# Patient Record
Sex: Female | Born: 1960 | Race: White | Hispanic: No | Smoking: Never smoker
Health system: Southern US, Community
[De-identification: ages and names within clinical notes are randomized; demographics above are authoritative.]

## PROBLEM LIST (undated history)

## (undated) HISTORY — PX: KNEE SURGERY: SHX244

---

## 2006-09-24 ENCOUNTER — Ambulatory Visit: Payer: Self-pay | Admitting: Family Medicine

## 2006-10-27 ENCOUNTER — Ambulatory Visit: Payer: Self-pay | Admitting: Orthopaedic Surgery

## 2006-10-28 ENCOUNTER — Ambulatory Visit: Payer: Self-pay | Admitting: Orthopaedic Surgery

## 2009-07-31 ENCOUNTER — Ambulatory Visit: Payer: Self-pay | Admitting: Physician Assistant

## 2010-01-11 ENCOUNTER — Ambulatory Visit: Payer: Self-pay | Admitting: Family Medicine

## 2011-02-13 ENCOUNTER — Ambulatory Visit: Payer: Self-pay | Admitting: Internal Medicine

## 2012-03-04 ENCOUNTER — Ambulatory Visit: Payer: Self-pay | Admitting: Family Medicine

## 2012-08-09 ENCOUNTER — Other Ambulatory Visit: Payer: Self-pay | Admitting: Nurse Practitioner

## 2012-08-09 LAB — SYNOVIAL CELL COUNT + DIFF, W/ CRYSTALS
Basophil: 0 %
Lymphocytes: 49 %
Neutrophils: 11 %
Nucleated Cell Count: 1293 /mm3
Other Cells BF: 0 %

## 2012-08-20 ENCOUNTER — Ambulatory Visit: Payer: Self-pay | Admitting: Orthopedic Surgery

## 2014-09-20 ENCOUNTER — Ambulatory Visit: Payer: Self-pay | Admitting: Family Medicine

## 2014-10-24 ENCOUNTER — Ambulatory Visit: Payer: Self-pay | Admitting: Family Medicine

## 2014-11-17 DIAGNOSIS — M7500 Adhesive capsulitis of unspecified shoulder: Secondary | ICD-10-CM | POA: Insufficient documentation

## 2014-11-17 DIAGNOSIS — M7582 Other shoulder lesions, left shoulder: Secondary | ICD-10-CM | POA: Insufficient documentation

## 2015-03-28 ENCOUNTER — Other Ambulatory Visit: Payer: Self-pay

## 2015-03-28 DIAGNOSIS — M25512 Pain in left shoulder: Secondary | ICD-10-CM

## 2015-08-30 ENCOUNTER — Encounter: Payer: Self-pay | Admitting: Family Medicine

## 2015-08-30 ENCOUNTER — Ambulatory Visit (INDEPENDENT_AMBULATORY_CARE_PROVIDER_SITE_OTHER): Payer: BC Managed Care – PPO | Admitting: Family Medicine

## 2015-08-30 VITALS — BP 100/80 | HR 80 | Ht 66.0 in | Wt 159.0 lb

## 2015-08-30 DIAGNOSIS — J0100 Acute maxillary sinusitis, unspecified: Secondary | ICD-10-CM | POA: Diagnosis not present

## 2015-08-30 DIAGNOSIS — J40 Bronchitis, not specified as acute or chronic: Secondary | ICD-10-CM

## 2015-08-30 MED ORDER — AZITHROMYCIN 250 MG PO TABS: ORAL_TABLET | ORAL | Status: DC

## 2015-08-30 MED ORDER — GUAIFENESIN-CODEINE 100-10 MG/5ML PO SOLN: 5.0000 mL | Freq: Three times a day (TID) | ORAL | Status: DC | PRN

## 2015-08-30 NOTE — Progress Notes (Signed)
Name: Tonya Cox   MRN: [id number]    DOB: [birth year]   Date:08/30/2015       Progress Note  Subjective  Chief Complaint  Chief Complaint  Patient presents with  . Sinusitis    cough and cong/ drainage in throat    Sinusitis This is a new problem. The current episode started in the past 7 days. The problem has been gradually worsening since onset. There has been no fever. Her pain is at a severity of 3/10. The pain is mild. Associated symptoms include congestion, coughing, headaches, sinus pressure and a sore throat. Pertinent negatives include no chills, diaphoresis, ear pain, neck pain, shortness of breath, sneezing or swollen glands. Past treatments include acetaminophen. The treatment provided no relief.  Cough This is a new problem. The current episode started in the past 7 days. The cough is productive of purulent sputum. Associated symptoms include headaches and a sore throat. Pertinent negatives include no chest pain, chills, ear pain, fever, heartburn, myalgias, rash, shortness of breath, weight loss or wheezing. She has tried nothing for the symptoms. The treatment provided no relief. There is no history of environmental allergies.    No problem-specific assessment & plan notes found for this encounter.   No past medical history on file.  Past Surgical History  Procedure Laterality Date  . Knee surgery Left     No family history on file.  Social History   Social History  . Marital Status: Married    Spouse Name: N/A  . Number of Children: N/A  . Years of Education: N/A   Occupational History  . Not on file.   Social History Main Topics  . Smoking status: Never Smoker   . Smokeless tobacco: Not on file  . Alcohol Use: 0.0 oz/week    0 Standard drinks or equivalent per week  . Drug Use: No  . Sexual Activity: Not on file   Other Topics Concern  . Not on file   Social History Narrative  . No narrative on file    No Known Allergies   Review of  Systems  Constitutional: Negative for fever, chills, weight loss, malaise/fatigue and diaphoresis.  HENT: Positive for congestion, sinus pressure and sore throat. Negative for ear discharge, ear pain and sneezing.   Eyes: Negative for blurred vision.  Respiratory: Positive for cough. Negative for sputum production, shortness of breath and wheezing.   Cardiovascular: Negative for chest pain, palpitations and leg swelling.  Gastrointestinal: Negative for heartburn, nausea, abdominal pain, diarrhea, constipation, blood in stool and melena.  Genitourinary: Negative for dysuria, urgency, frequency and hematuria.  Musculoskeletal: Negative for myalgias, back pain, joint pain and neck pain.  Skin: Negative for rash.  Neurological: Positive for headaches. Negative for dizziness, tingling, sensory change and focal weakness.  Endo/Heme/Allergies: Negative for environmental allergies and polydipsia. Does not bruise/bleed easily.  Psychiatric/Behavioral: Negative for depression and suicidal ideas. The patient is not nervous/anxious and does not have insomnia.      Objective  Filed Vitals:   08/30/15 1512  BP: 100/80  Pulse: 80  Height:  (1.676 m)  Weight: 159 lb (72.122 kg)    Physical Exam  Constitutional: She is well-developed, well-nourished, and in no distress. No distress.  HENT:  Head: Normocephalic and atraumatic.  Right Ear: External ear normal.  Left Ear: External ear normal.  Nose: Nose normal.  Mouth/Throat: Oropharynx is clear and moist.  Eyes: Conjunctivae and EOM are normal. Pupils are equal, round, and reactive to  light. Right eye exhibits no discharge. Left eye exhibits no discharge.  Neck: Normal range of motion. Neck supple. No JVD present. No thyromegaly present.  Cardiovascular: Normal rate, regular rhythm, normal heart sounds and intact distal pulses.  Exam reveals no gallop and no friction rub.   No murmur heard. Pulmonary/Chest: Effort normal and breath sounds  normal.  Abdominal: Soft. Bowel sounds are normal. She exhibits no mass. There is no tenderness. There is no guarding.  Musculoskeletal: Normal range of motion. She exhibits no edema.  Lymphadenopathy:    She has no cervical adenopathy.  Neurological: She is alert. She has normal reflexes.  Skin: Skin is warm and dry. She is not diaphoretic.  Psychiatric: Mood and affect normal.  Nursing note and vitals reviewed.     Assessment & Plan  Problem List Items Addressed This Visit    None    Visit Diagnoses    Acute maxillary sinusitis, recurrence not specified    -  Primary    Relevant Medications    azithromycin (ZITHROMAX) 250 MG tablet    guaiFENesin-codeine 100-10 MG/5ML syrup    Bronchitis        Relevant Medications    azithromycin (ZITHROMAX) 250 MG tablet    guaiFENesin-codeine 100-10 MG/5ML syrup         Dr. [facility]  08/30/2015

## 2015-09-03 ENCOUNTER — Other Ambulatory Visit: Payer: Self-pay

## 2015-09-03 DIAGNOSIS — M25561 Pain in right knee: Secondary | ICD-10-CM

## 2015-09-04 ENCOUNTER — Encounter: Payer: Self-pay | Admitting: Family Medicine

## 2015-09-04 ENCOUNTER — Ambulatory Visit (INDEPENDENT_AMBULATORY_CARE_PROVIDER_SITE_OTHER): Payer: BC Managed Care – PPO | Admitting: Family Medicine

## 2015-09-04 VITALS — BP 130/80 | HR 72 | Ht 66.0 in | Wt 159.0 lb

## 2015-09-04 DIAGNOSIS — S83206A Unspecified tear of unspecified meniscus, current injury, right knee, initial encounter: Secondary | ICD-10-CM

## 2015-09-04 MED ORDER — TRAMADOL HCL 50 MG PO TABS: 50.0000 mg | ORAL_TABLET | Freq: Three times a day (TID) | ORAL | Status: DC | PRN

## 2015-09-04 NOTE — Progress Notes (Signed)
Name: Tonya Cox   MRN: [id number]    DOB: [birth year]   Date:09/04/2015       Progress Note  Subjective  Chief Complaint  Chief Complaint  Patient presents with  . Knee Pain    R) knee pain that started 5 days ago- woke up with pain after crawling around on floor. Constant ache that is sharp at times especially when trying to move it to stand up.     Knee Pain  The incident occurred more than 1 week ago. There was no injury mechanism. The pain is present in the right knee. The quality of the pain is described as aching. The pain is at a severity of 8/10. The pain is severe. The pain has been fluctuating since onset. Associated symptoms include an inability to bear weight and a loss of motion. Pertinent negatives include no tingling. The symptoms are aggravated by movement. She has tried acetaminophen (tramadol) for the symptoms. The treatment provided mild relief.    No problem-specific assessment & plan notes found for this encounter.   No past medical history on file.  Past Surgical History  Procedure Laterality Date  . Knee surgery Left     No family history on file.  Social History   Social History  . Marital Status: Married    Spouse Name: N/A  . Number of Children: N/A  . Years of Education: N/A   Occupational History  . Not on file.   Social History Main Topics  . Smoking status: Never Smoker   . Smokeless tobacco: Not on file  . Alcohol Use: 0.0 oz/week    0 Standard drinks or equivalent per week  . Drug Use: No  . Sexual Activity: Not on file   Other Topics Concern  . Not on file   Social History Narrative    No Known Allergies   Review of Systems  Constitutional: Negative for fever, chills, weight loss and malaise/fatigue.  HENT: Negative for ear discharge, ear pain and sore throat.   Eyes: Negative for blurred vision.  Respiratory: Negative for cough, sputum production, shortness of breath and wheezing.   Cardiovascular: Negative for  chest pain, palpitations and leg swelling.  Gastrointestinal: Negative for heartburn, nausea, abdominal pain, diarrhea, constipation, blood in stool and melena.  Genitourinary: Negative for dysuria, urgency, frequency and hematuria.  Musculoskeletal: Negative for myalgias, back pain, joint pain and neck pain.  Skin: Negative for rash.  Neurological: Negative for dizziness, tingling, sensory change, focal weakness and headaches.  Endo/Heme/Allergies: Negative for environmental allergies and polydipsia. Does not bruise/bleed easily.  Psychiatric/Behavioral: Negative for depression and suicidal ideas. The patient is not nervous/anxious and does not have insomnia.      Objective  Filed Vitals:   09/04/15 1442  BP: 130/80  Pulse: 72  Height:  (1.676 m)  Weight: 159 lb (72.122 kg)    Physical Exam  Constitutional: She is well-developed, well-nourished, and in no distress. No distress.  HENT:  Head: Normocephalic and atraumatic.  Nose: Nose normal.  Mouth/Throat: Oropharynx is clear and moist.  Eyes: Conjunctivae and EOM are normal. Pupils are equal, round, and reactive to light. Right eye exhibits no discharge. Left eye exhibits no discharge.  Neck: Normal range of motion. Neck supple. No JVD present.  Cardiovascular: Normal rate, regular rhythm, normal heart sounds and intact distal pulses.  Exam reveals no gallop and no friction rub.   No murmur heard. Pulmonary/Chest: Effort normal and breath sounds normal.  Abdominal: Soft. Bowel  sounds are normal.  Musculoskeletal: Normal range of motion. She exhibits tenderness. She exhibits no edema.  Right medial joint line  Neurological: She is alert.  Skin: Skin is warm and dry. She is not diaphoretic.  Psychiatric: Mood and affect normal.  Nursing note and vitals reviewed.     Assessment & Plan  Problem List Items Addressed This Visit    None    Visit Diagnoses    Acute meniscal tear of knee, right, initial encounter    -   Primary    Relevant Medications    traMADol (ULTRAM) 50 MG tablet         Dr. [facility]  09/04/2015

## 2015-09-04 NOTE — Patient Instructions (Signed)
Meniscus Tear °A meniscus tear is a knee injury in which a piece of the meniscus is torn. The meniscus is a thick, rubbery, wedge-shaped cartilage in the knee. Two menisci are located in each knee. They sit between the upper bone (femur) and lower bone (tibia) that make up the knee joint. Each meniscus acts as a shock absorber for the knee. °A torn meniscus is one of the most common types of knee injuries. This injury can range from mild to severe. Surgery may be needed for a severe tear. °CAUSES °This injury may be caused by any squatting, twisting, or pivoting movement. Sports-related injuries are the most common cause. These often occur from: °· Running and stopping suddenly. °· Changing direction. °· Being tackled or knocked off your feet. °As people get older, their meniscus gets thinner and weaker. In these people, tears can happen more easily, such as from climbing stairs.  °RISK FACTORS °This injury is more likely to happen to: °· People who play contact sports. °· Males. °· People who are 30-40 years of age. °SYMPTOMS  °Symptoms of this injury include: °· Knee pain, especially at the side of the knee joint. You may feel pain when the injury occurs, or you may only hear a pop and feel pain later. °· A feeling that your knee is clicking, catching, locking, or giving way. °· Not being able to fully bend or extend your knee. °· Bruising or swelling in your knee. °DIAGNOSIS  °This injury may be diagnosed based on your symptoms and a physical exam. The physical exam may include: °· Moving your knee in different ways. °· Feeling for tenderness. °· Listening for a clicking sound. °· Checking if your knee locks or catches. °You may also have tests, such as: °· X-rays. °· MRI. °· A procedure to look inside your knee with a narrow surgical telescope (arthroscopy). °You may be referred to a knee specialist (orthopedic surgeon). °TREATMENT  °Treatment for this injury depends on the severity of the tear. Treatment for a  mild tear may include: °· Rest. °· Medicine to reduce pain and swelling. This is usually a nonsteroidal anti-inflammatory drug (NSAID). °· A knee brace or an elastic sleeve or wrap. °· Using crutches or a walker to keep weight off your knee and to help you walk. °· Exercises to strengthen your knee (physical therapy). °You may need surgery if you have a severe tear or if other treatments are not working.  °HOME CARE INSTRUCTIONS °Managing Pain and Swelling °· Take over-the-counter and prescription medicines only as told by your health care provider. °· If directed, apply ice to the injured area: °¨ Put ice in a plastic bag. °¨ Place a towel between your skin and the bag. °¨ Leave the ice on for 20 minutes, 2-3 times per day. °· Raise (elevate) the injured area above the level of your heart while you are sitting or lying down. °Activity °· Do not use the injured limb to support your body weight until your health care provider says that you can. Use crutches or a walker as told by your health care provider. °· Return to your normal activities as told by your health care provider. Ask your health care provider what activities are safe for you. °· Perform range-of-motion exercises only as told by your health care provider. °· Begin doing exercises to strengthen your knee and leg muscles only as told by your health care provider. After you recover, your health care provider may recommend these exercises to   help prevent another injury. °General Instructions °· Use a knee brace or elastic wrap as told by your health care provider. °· Keep all follow-up visits as told by your health care provider. This is important. °SEEK MEDICAL CARE IF: °· You have a fever. °· Your knee becomes red, tender, or swollen. °· Your pain medicine is not helping. °· Your symptoms get worse or do not improve after 2 weeks of home care. °  °This information is not intended to replace advice given to you by your health care provider. Make sure you  discuss any questions you have with your health care provider. °  °Document Released: 10/18/2002 Document Revised: 04/18/2015 Document Reviewed: 11/20/2014 °Elsevier Interactive Patient Education ©2016 [employer] ° °

## 2015-09-06 ENCOUNTER — Other Ambulatory Visit: Payer: Self-pay

## 2015-09-06 DIAGNOSIS — M25561 Pain in right knee: Secondary | ICD-10-CM

## 2015-09-06 MED ORDER — HYDROCODONE-ACETAMINOPHEN 5-300 MG PO TABS: 1.0000 | ORAL_TABLET | Freq: Three times a day (TID) | ORAL | Status: DC | PRN

## 2015-09-17 ENCOUNTER — Other Ambulatory Visit: Payer: Self-pay | Admitting: Physician Assistant

## 2015-09-17 DIAGNOSIS — S83211A Bucket-handle tear of medial meniscus, current injury, right knee, initial encounter: Secondary | ICD-10-CM

## 2015-09-28 ENCOUNTER — Ambulatory Visit (HOSPITAL_COMMUNITY)
Admission: RE | Admit: 2015-09-28 | Discharge: 2015-09-28 | Disposition: A | Discharge status: Home / Self Care | Payer: BC Managed Care – PPO | Source: Ambulatory Visit | Attending: Physician Assistant | Admitting: Physician Assistant

## 2015-09-28 DIAGNOSIS — S83211A Bucket-handle tear of medial meniscus, current injury, right knee, initial encounter: Secondary | ICD-10-CM | POA: Insufficient documentation

## 2015-09-28 DIAGNOSIS — X58XXXA Exposure to other specified factors, initial encounter: Secondary | ICD-10-CM | POA: Insufficient documentation

## 2015-09-28 DIAGNOSIS — M25461 Effusion, right knee: Secondary | ICD-10-CM | POA: Insufficient documentation

## 2016-03-07 ENCOUNTER — Ambulatory Visit (INDEPENDENT_AMBULATORY_CARE_PROVIDER_SITE_OTHER): Payer: BC Managed Care – PPO | Admitting: Family Medicine

## 2016-03-07 ENCOUNTER — Encounter: Payer: Self-pay | Admitting: Family Medicine

## 2016-03-07 VITALS — BP 140/100 | HR 60 | Ht 69.0 in | Wt 160.0 lb

## 2016-03-07 DIAGNOSIS — L259 Unspecified contact dermatitis, unspecified cause: Secondary | ICD-10-CM | POA: Diagnosis not present

## 2016-03-07 MED ORDER — PREDNISONE 10 MG PO TABS: ORAL_TABLET | ORAL | 1 refills | Status: DC

## 2016-03-07 NOTE — Progress Notes (Signed)
Name: Tonya Cox   MRN: [id number]    DOB: [birth year]   Date:03/07/2016       Progress Note  Subjective  Chief Complaint  Chief Complaint  Patient presents with  . Rash    poison ivey on arm- been using hydrocortisone 2%- eyes are now swollen and itchy. Eyes were matted this am    Rash  This is a new problem. The current episode started in the past 7 days. The problem has been gradually worsening since onset. The affected locations include the face. The rash is characterized by itchiness and pain (described as burning). Pertinent negatives include no congestion, cough, eye pain, facial edema or fever. Past treatments include antihistamine and topical steroids. The treatment provided no relief.    No problem-specific Assessment & Plan notes found for this encounter.   History reviewed. No pertinent past medical history.  Past Surgical History:  Procedure Laterality Date  . KNEE SURGERY Left     Family History  Problem Relation Age of Onset  . Cancer Father   . Diabetes Father   . Heart disease Maternal Grandfather   . Diabetes Paternal Grandfather     Social History   Social History  . Marital status: Married    Spouse name: N/A  . Number of children: N/A  . Years of education: N/A   Occupational History  . Not on file.   Social History Main Topics  . Smoking status: Never Smoker  . Smokeless tobacco: Never Used  . Alcohol use 0.0 oz/week  . Drug use: No  . Sexual activity: Yes   Other Topics Concern  . Not on file   Social History Narrative  . No narrative on file    No Known Allergies   Review of Systems  Constitutional: Negative for fever.  HENT: Negative for congestion.   Eyes: Negative for pain.  Respiratory: Negative for cough.   Skin: Positive for rash.     Objective  Vitals:   03/07/16 1132  BP: (!) 140/100  Pulse: 60  Weight: 160 lb (72.6 kg)  Height:  (1.753 m)    Physical Exam  Constitutional: She is  well-developed, well-nourished, and in no distress. No distress.  HENT:  Head: Normocephalic and atraumatic.  Right Ear: External ear normal.  Left Ear: External ear normal.  Nose: Nose normal.  Mouth/Throat: Oropharynx is clear and moist.  Eyes: Conjunctivae and EOM are normal. Pupils are equal, round, and reactive to light. Right eye exhibits no discharge. Left eye exhibits no discharge.  Neck: Normal range of motion. Neck supple. No JVD present. No thyromegaly present.  Cardiovascular: Normal rate, regular rhythm, normal heart sounds and intact distal pulses.  Exam reveals no gallop and no friction rub.   No murmur heard. Pulmonary/Chest: Effort normal and breath sounds normal.  Abdominal: Soft. Bowel sounds are normal. She exhibits no mass. There is no tenderness. There is no guarding.  Musculoskeletal: Normal range of motion. She exhibits no edema or tenderness.  Lymphadenopathy:    She has no cervical adenopathy.  Neurological: She is alert. She has normal reflexes.  Skin: Skin is warm and dry. Rash noted. She is not diaphoretic. There is erythema. No pallor.  Psychiatric: Mood and affect normal.  Nursing note and vitals reviewed.     Assessment & Plan  Problem List Items Addressed This Visit    None    Visit Diagnoses    Contact dermatitis    -  Primary  Relevant Medications   predniSONE (DELTASONE) 10 MG tablet        Dr. Elizabeth Sauer [facility]  03/07/16

## 2016-05-14 ENCOUNTER — Ambulatory Visit: Payer: BC Managed Care – PPO | Admitting: Family Medicine

## 2016-10-06 ENCOUNTER — Ambulatory Visit (INDEPENDENT_AMBULATORY_CARE_PROVIDER_SITE_OTHER): Payer: BC Managed Care – PPO | Admitting: Family Medicine

## 2016-10-06 VITALS — BP 140/82 | HR 88 | Temp 98.0°F | Ht 69.0 in | Wt 160.0 lb

## 2016-10-06 DIAGNOSIS — R69 Illness, unspecified: Secondary | ICD-10-CM

## 2016-10-06 DIAGNOSIS — J0100 Acute maxillary sinusitis, unspecified: Secondary | ICD-10-CM

## 2016-10-06 DIAGNOSIS — J111 Influenza due to unidentified influenza virus with other respiratory manifestations: Secondary | ICD-10-CM

## 2016-10-06 MED ORDER — OSELTAMIVIR PHOSPHATE 75 MG PO CAPS: 75.0000 mg | ORAL_CAPSULE | Freq: Two times a day (BID) | ORAL | 0 refills | Status: DC

## 2016-10-06 NOTE — Progress Notes (Signed)
Name: Tonya Cox   MRN: [id number]    DOB: Oct 08, 1960   Date:10/06/2016       Progress Note  Subjective  Chief Complaint  Chief Complaint  Patient presents with  . Sinusitis    on day 5 of [employer]- "half of class is out with confirmed flu and viruses"- had a fever on Friday, slept. On Sat started with body aches and fever in the pm. Headache is really bad as well as back ache    Sinusitis  This is a new problem. The current episode started in the past 7 days. The problem is unchanged. The maximum temperature recorded prior to her arrival was 100.4 - 100.9 F. Associated symptoms include chills, congestion, diaphoresis, headaches, neck pain, sinus pressure and sneezing. Pertinent negatives include no coughing, ear pain, hoarse voice, shortness of breath, sore throat or swollen glands. Past treatments include acetaminophen (azithromycin). The treatment provided mild relief.  Fever   This is a new problem. The current episode started in the past 7 days. The problem occurs intermittently. The problem has been waxing and waning. Her temperature was unmeasured prior to arrival. Associated symptoms include congestion and headaches. Pertinent negatives include no abdominal pain, chest pain, coughing, diarrhea, ear pain, muscle aches, nausea, rash, sleepiness, sore throat, urinary pain, vomiting or wheezing. She has tried acetaminophen for the symptoms.    No problem-specific Assessment & Plan notes found for this encounter.   No past medical history on file.  Past Surgical History:  Procedure Laterality Date  . KNEE SURGERY Left     Family History  Problem Relation Age of Onset  . Cancer Father   . Diabetes Father   . Heart disease Maternal Grandfather   . Diabetes Paternal Grandfather     Social History   Social History  . Marital status: Married    Spouse name: N/A  . Number of children: N/A  . Years of education: N/A   Occupational History  . Not on file.   Social  History Main Topics  . Smoking status: Never Smoker  . Smokeless tobacco: Never Used  . Alcohol use 0.0 oz/week  . Drug use: No  . Sexual activity: Yes   Other Topics Concern  . Not on file   Social History Narrative  . No narrative on file    No Known Allergies  Outpatient Medications Prior to Visit  Medication Sig Dispense Refill  . cyclobenzaprine (FLEXERIL) 5 MG tablet Take 5 mg by mouth at bedtime.    Marland Kitchen Hydrocodone-Acetaminophen 5-300 MG TABS Take 1 tablet by mouth every 8 (eight) hours as needed. (Patient not taking: Reported on 03/07/2016) 30 each 0  . predniSONE (DELTASONE) 10 MG tablet Taper 6,6,6,5,5,5,4,4,3,3,2,2,1,1 53 tablet 1   No facility-administered medications prior to visit.     Review of Systems  Constitutional: Positive for chills, diaphoresis and fever. Negative for malaise/fatigue and weight loss.  HENT: Positive for congestion, sinus pressure and sneezing. Negative for ear discharge, ear pain, hoarse voice and sore throat.   Eyes: Negative for blurred vision.  Respiratory: Negative for cough, sputum production, shortness of breath and wheezing.   Cardiovascular: Negative for chest pain, palpitations and leg swelling.  Gastrointestinal: Negative for abdominal pain, blood in stool, constipation, diarrhea, heartburn, melena, nausea and vomiting.  Genitourinary: Negative for dysuria, frequency, hematuria and urgency.  Musculoskeletal: Positive for neck pain. Negative for back pain, joint pain and myalgias.  Skin: Negative for rash.  Neurological: Positive for headaches. Negative for dizziness,  tingling, sensory change and focal weakness.  Endo/Heme/Allergies: Negative for environmental allergies and polydipsia. Does not bruise/bleed easily.  Psychiatric/Behavioral: Negative for depression and suicidal ideas. The patient is not nervous/anxious and does not have insomnia.        No ahedonism     Objective  Vitals:   10/06/16 0758  BP: 140/82  Pulse: 88   Temp: 98 F (36.7 C)  TempSrc: Oral  Weight: 160 lb (72.6 kg)  Height: 5\' 9"  (1.753 m)    Physical Exam  Constitutional: She is well-developed, well-nourished, and in no distress. No distress.  HENT:  Head: Normocephalic and atraumatic.  Right Ear: External ear normal.  Left Ear: External ear normal.  Nose: Right sinus exhibits maxillary sinus tenderness. Right sinus exhibits no frontal sinus tenderness. Left sinus exhibits maxillary sinus tenderness. Left sinus exhibits no frontal sinus tenderness.  Mouth/Throat: Oropharynx is clear and moist.  Eyes: Conjunctivae and EOM are normal. Pupils are equal, round, and reactive to light. Right eye exhibits no discharge. Left eye exhibits no discharge.  Neck: Normal range of motion. Neck supple. No JVD present. No thyromegaly present.  Cardiovascular: Normal rate, regular rhythm, normal heart sounds and intact distal pulses.  Exam reveals no gallop and no friction rub.   No murmur heard. Pulmonary/Chest: Effort normal and breath sounds normal. She has no wheezes. She has no rales.  Abdominal: Soft. Bowel sounds are normal. She exhibits no mass. There is no tenderness. There is no guarding.  Musculoskeletal: Normal range of motion. She exhibits no edema.  Lymphadenopathy:    She has no cervical adenopathy.  Neurological: She is alert. She has normal reflexes.  Skin: Skin is warm and dry. She is not diaphoretic.  Psychiatric: Mood and affect normal.  Nursing note and vitals reviewed.     Assessment & Plan  Problem List Items Addressed This Visit    None    Visit Diagnoses    Influenza-like illness    -  Primary   Relevant Medications   oseltamivir (TAMIFLU) 75 MG capsule   Acute maxillary sinusitis, recurrence not specified       on azithromycin   Relevant Medications   oseltamivir (TAMIFLU) 75 MG capsule      Meds ordered this encounter  Medications  . oseltamivir (TAMIFLU) 75 MG capsule    Sig: Take 1 capsule (75 mg  total) by mouth 2 (two) times daily.    Dispense:  10 capsule    Refill:  0      Dr. [facility]  10/06/16

## 2016-10-07 IMAGING — CR DG SHOULDER 3+V*L*
3 series · 3 of 3 positions shown · non-contrast
Comparison: None.

CLINICAL DATA: Left anterior shoulder pain.

EXAM:
DG SHOULDER 3+VIEWS LEFT

[shoulder grashey]
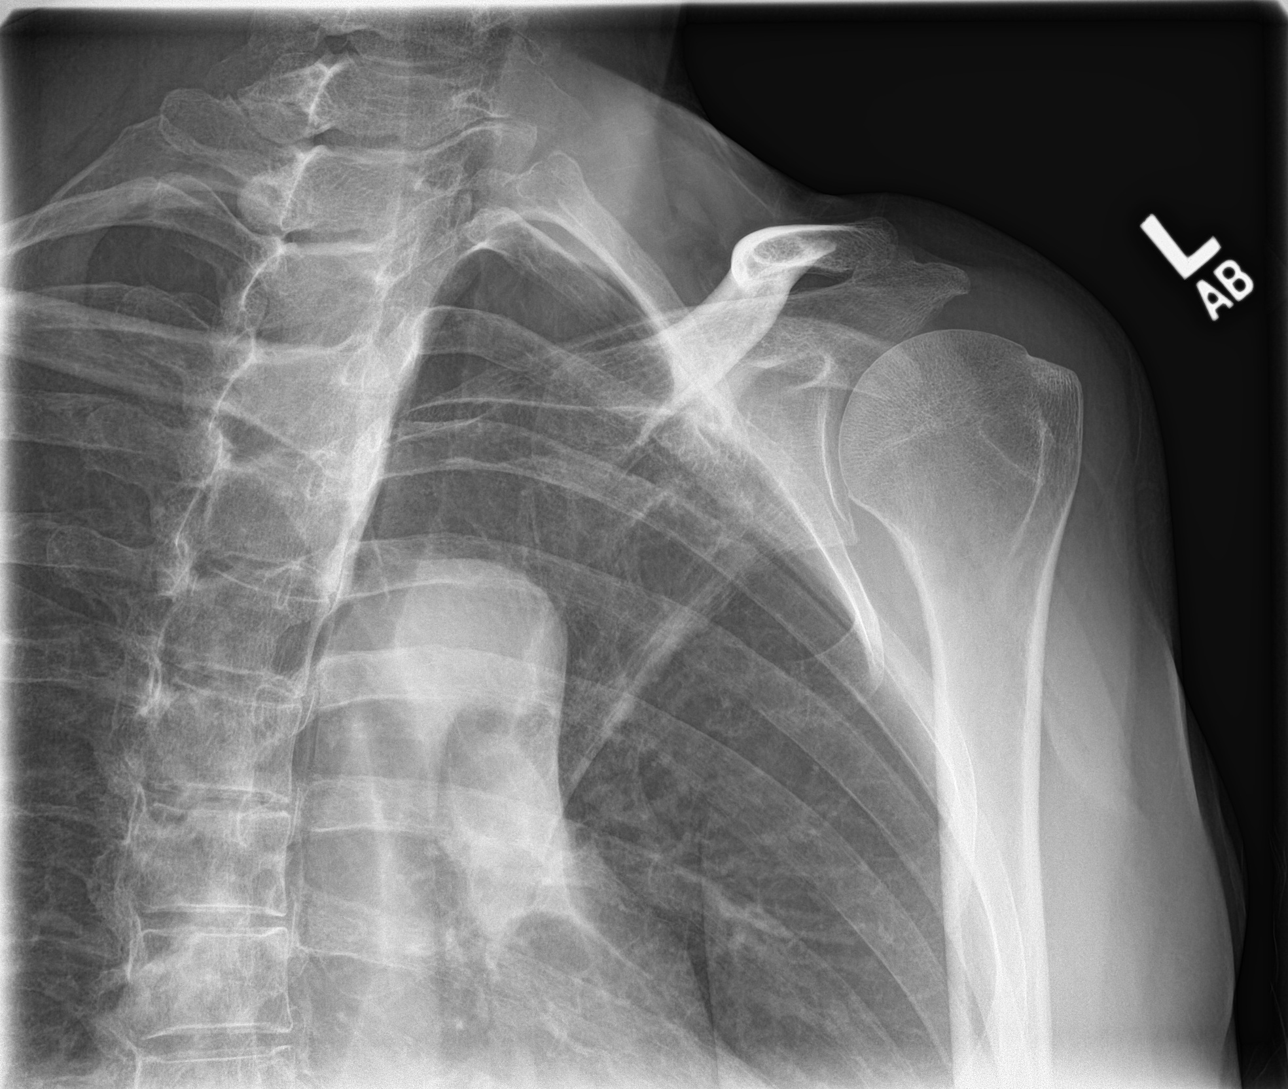

[shoulder y view]
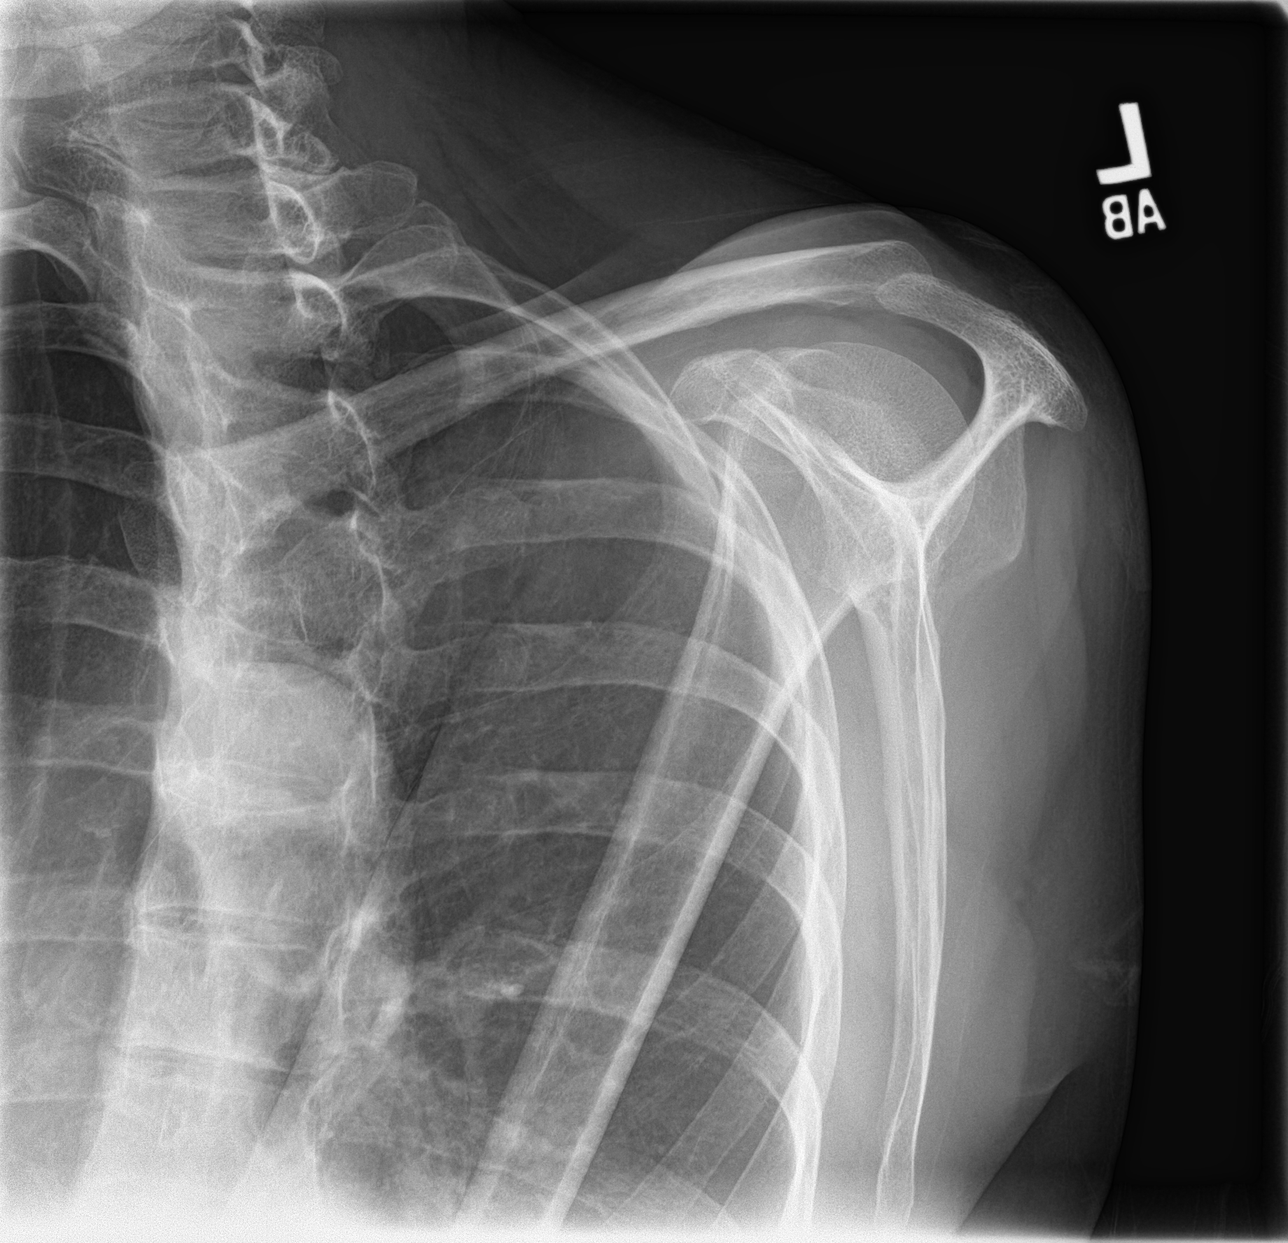

[shoulder axial]
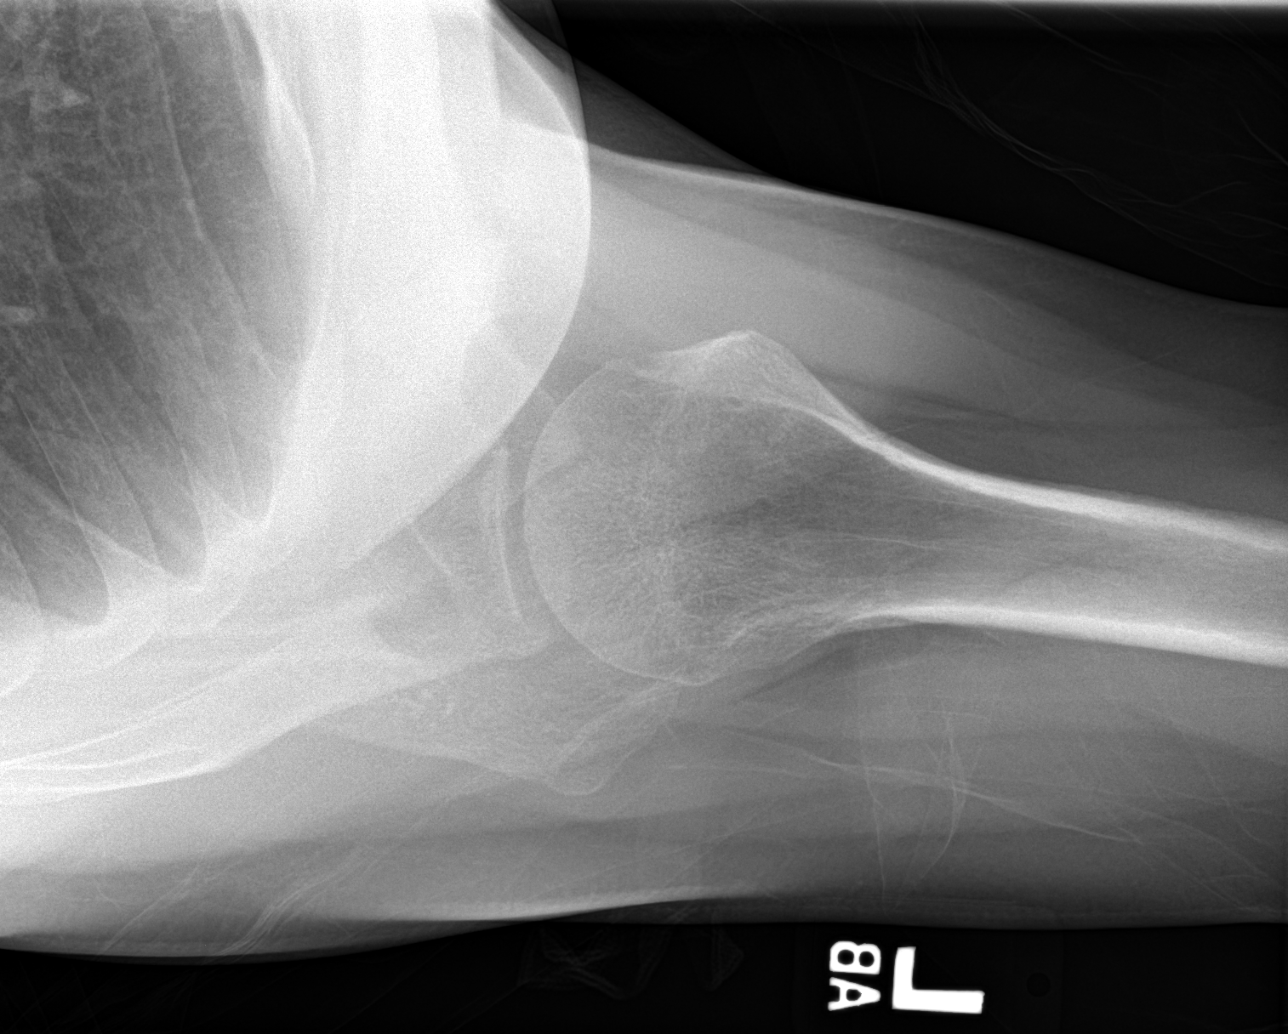

[3 of 3 positions shown; findings below may reference images not displayed]

FINDINGS: No fracture or dislocation identified. No evidence of significant
degenerative disease or bony lesions. Soft tissues are unremarkable.
IMPRESSION: Normal left shoulder.

## 2016-10-12 ENCOUNTER — Encounter: Payer: Self-pay | Admitting: Emergency Medicine

## 2016-10-12 ENCOUNTER — Emergency Department
Admission: EM | Admit: 2016-10-12 | Discharge: 2016-10-12 | Disposition: A | Discharge status: Home / Self Care | Payer: BC Managed Care – PPO | Attending: Emergency Medicine | Admitting: Emergency Medicine

## 2016-10-12 DIAGNOSIS — S01511A Laceration without foreign body of lip, initial encounter: Secondary | ICD-10-CM | POA: Insufficient documentation

## 2016-10-12 DIAGNOSIS — Y9389 Activity, other specified: Secondary | ICD-10-CM | POA: Insufficient documentation

## 2016-10-12 DIAGNOSIS — Z23 Encounter for immunization: Secondary | ICD-10-CM | POA: Insufficient documentation

## 2016-10-12 DIAGNOSIS — Y999 Unspecified external cause status: Secondary | ICD-10-CM | POA: Insufficient documentation

## 2016-10-12 DIAGNOSIS — Y929 Unspecified place or not applicable: Secondary | ICD-10-CM | POA: Diagnosis not present

## 2016-10-12 DIAGNOSIS — S01551A Open bite of lip, initial encounter: Secondary | ICD-10-CM | POA: Diagnosis present

## 2016-10-12 DIAGNOSIS — W540XXA Bitten by dog, initial encounter: Secondary | ICD-10-CM | POA: Insufficient documentation

## 2016-10-12 DIAGNOSIS — Z79899 Other long term (current) drug therapy: Secondary | ICD-10-CM | POA: Insufficient documentation

## 2016-10-12 DIAGNOSIS — S0181XA Laceration without foreign body of other part of head, initial encounter: Secondary | ICD-10-CM

## 2016-10-12 DIAGNOSIS — S0185XA Open bite of other part of head, initial encounter: Secondary | ICD-10-CM

## 2016-10-12 MED ORDER — TRAMADOL HCL 50 MG PO TABS: 50.0000 mg | ORAL_TABLET | Freq: Four times a day (QID) | ORAL | 0 refills | Status: DC | PRN

## 2016-10-12 MED ORDER — DIAZEPAM 5 MG PO TABS: 5.0000 mg | ORAL_TABLET | Freq: Once | ORAL | Status: DC

## 2016-10-12 MED ORDER — LIDOCAINE-EPINEPHRINE-TETRACAINE (LET) SOLUTION
3.0000 mL | Freq: Once | NASAL | Status: AC
  Administered 2016-10-12: 3 mL via TOPICAL
  Filled 2016-10-12: qty 3

## 2016-10-12 MED ORDER — TETANUS-DIPHTH-ACELL PERTUSSIS 5-2.5-18.5 LF-MCG/0.5 IM SUSP
0.5000 mL | Freq: Once | INTRAMUSCULAR | Status: AC
  Administered 2016-10-12: 0.5 mL via INTRAMUSCULAR
  Filled 2016-10-12: qty 0.5

## 2016-10-12 MED ORDER — AMOXICILLIN-POT CLAVULANATE 875-125 MG PO TABS: 1.0000 | ORAL_TABLET | Freq: Two times a day (BID) | ORAL | 0 refills | Status: AC

## 2016-10-12 MED ORDER — AMOXICILLIN-POT CLAVULANATE 875-125 MG PO TABS
1.0000 | ORAL_TABLET | Freq: Once | ORAL | Status: AC
  Administered 2016-10-12: 1 via ORAL
  Filled 2016-10-12: qty 1

## 2016-10-12 NOTE — ED Provider Notes (Signed)
[facility]-EMERGENCY DEPARTMENT Provider Note   CSN: [id number] Arrival date & time: 10/12/16  1851     History   Chief Complaint Chief Complaint  Patient presents with  . Animal Bite    HPI Tonya Cox is a 56 y.o. female presents emergent for evaluation of dog bite to the left lower lip. Patient was bitten just prior to arrival. Dogs vaccinations are up-to-date. This is the patient's dog. Patient states her last tetanus was greater than 10 years ago. She has a small bite wound to the lower lip. No other trauma or injury. Her pain is moderate. No numbness or tingling.  HPI  History reviewed. No pertinent past medical history.  There are no active problems to display for this patient.   Past Surgical History:  Procedure Laterality Date  . KNEE SURGERY Left     OB History    No data available       Home Medications    Prior to Admission medications   Medication Sig Start Date End Date Taking? Authorizing Provider  amoxicillin-clavulanate (AUGMENTIN) 875-125 MG tablet Take 1 tablet by mouth every 12 (twelve) hours. 10/12/16 10/19/16  Evon Slack, PA-C  oseltamivir (TAMIFLU) 75 MG capsule Take 1 capsule (75 mg total) by mouth 2 (two) times daily. 10/06/16   Duanne Limerick, MD  traMADol (ULTRAM) 50 MG tablet Take 1 tablet (50 mg total) by mouth every 6 (six) hours as needed. 10/12/16   Evon Slack, PA-C    Family History Family History  Problem Relation Age of Onset  . Cancer Father   . Diabetes Father   . Heart disease Maternal Grandfather   . Diabetes Paternal Grandfather     Social History Social History  Substance Use Topics  . Smoking status: Never Smoker  . Smokeless tobacco: Never Used  . Alcohol use 0.0 oz/week     Allergies   Patient has no known allergies.   Review of Systems Review of Systems  Constitutional: Negative for chills and fever.  HENT: Negative for ear pain and sore throat.   Eyes: Negative for pain and visual disturbance.    Respiratory: Negative for cough and shortness of breath.   Cardiovascular: Negative for chest pain and palpitations.  Gastrointestinal: Negative for abdominal pain and vomiting.  Genitourinary: Negative for dysuria and hematuria.  Musculoskeletal: Negative for arthralgias and back pain.  Skin: Positive for wound. Negative for color change and rash.  Neurological: Negative for seizures and syncope.  All other systems reviewed and are negative.    Physical Exam Updated Vital Signs BP (!) 182/84 (BP Location: Left Arm)   Pulse 81   Temp 97.9 F (36.6 C) (Oral)   Resp 15   SpO2 100%   Physical Exam  Constitutional: She is oriented to person, place, and time. She appears well-developed and well-nourished. No distress.  HENT:  Head: Normocephalic and atraumatic.  Eyes: Conjunctivae are normal.  Neck: Normal range of motion. Neck supple.  Cardiovascular: Normal rate.   No murmur heard. Pulmonary/Chest: Effort normal. No respiratory distress.  Musculoskeletal: She exhibits no edema.  Neurological: She is alert and oriented to person, place, and time.  Skin: Skin is warm and dry.  Examination of the left lower lip shows a stellate laceration. No sign of foreign body. No through and through. No dental injury. Laceration thoroughly irrigated and repaired with #2 6-0 nylon sutures.  Psychiatric: She has a normal mood and affect. Her behavior is normal. Thought content normal.  Nursing note and vitals reviewed.    ED Treatments / Results  Labs (all labs ordered are listed, but only abnormal results are displayed) Labs Reviewed - No data to display  EKG  EKG Interpretation None       Radiology No results found.  Procedures Procedures (including critical care time) LACERATION REPAIR Performed by: Patience MuscaGAINES, THOMAS CHRISTOPHER Authorized by: Patience MuscaGAINES, THOMAS CHRISTOPHER Consent: Verbal consent obtained. Risks and benefits: risks, benefits and alternatives were  discussed Consent given by: patient Patient identity confirmed: provided demographic data Prepped and Draped in normal sterile fashion Wound explored  Laceration Location: left lower lip  Laceration Length: 1 cm No Foreign Bodies seen or palpated  Anesthesia: local infiltration  Local anesthetic: topical  LET Anesthetic total: 2 ml  Irrigation method: syringe Amount of cleaning: standard  Skin closure: #2 6-0 nylon sutures   [employer] interrupted  Patient tolerance: Patient tolerated the procedure well with no immediate complications.  Medications Ordered in ED Medications  diazepam (VALIUM) tablet 5 mg (5 mg Oral Not Given 10/12/16 2021)  amoxicillin-clavulanate (AUGMENTIN) 875-125 MG per tablet 1 tablet (1 tablet Oral Given 10/12/16 2019)  lidocaine-EPINEPHrine-tetracaine (LET) solution (3 mLs Topical Given 10/12/16 2021)  Tdap (BOOSTRIX) injection 0.5 mL (0.5 mLs Intramuscular Given 10/12/16 2019)     Initial Impression / Assessment and Plan / ED Course  I have reviewed the triage vital signs and the nursing notes.  Pertinent labs & imaging results that were available during my care of the patient were reviewed by me and considered in my medical decision making (see chart for details).     56 year old female with lower lip laceration from dog bite. She is placed on Augmentin. Tetanus is updated. #2 6-0 nylon sutures were placed. She'll follow-up in 5 days for suture removal.  Blood pressure elevated at discharge. Patient with no history of hypertension. Denies any headaches, vision changes, chest pain or shortness of breath. Patient states is normal for her to have hypertension in the doctor's office as well as with anxiety from having sutures placed. Patient advised to continue to monitor blood pressure, if any elevation return to the ED. She is educated on signs and symptoms return to the ED for.  Final Clinical Impressions(s) / ED Diagnoses   Final diagnoses:  Dog bite  of face, initial encounter  Facial laceration, initial encounter    New Prescriptions New Prescriptions   AMOXICILLIN-CLAVULANATE (AUGMENTIN) 875-125 MG TABLET    Take 1 tablet by mouth every 12 (twelve) hours.   TRAMADOL (ULTRAM) 50 MG TABLET    Take 1 tablet (50 mg total) by mouth every 6 (six) hours as needed.     Evon Slackhomas C Gaines, PA-C 10/12/16 2115    Evon Slackhomas C Gaines, PA-C 10/12/16 2125    Jeanmarie PlantJames A McShane, MD 10/13/16 [phone]

## 2016-10-12 NOTE — ED Triage Notes (Signed)
Pt's dog bit her in the mouth while playing. Minimal bleeding and swelling at this time.

## 2016-10-12 NOTE — ED Notes (Signed)

## 2016-10-12 NOTE — Discharge Instructions (Signed)
Please take antibiotics as prescribed. Please follow-up with PCP or walking clinic in 5 days for suture removal. Return to the ER for any increase in facial swelling warmth redness or drainage. One week after suture removal apply topical PROSIL as directed. Please make sure to wear sunscreen for the next year to help minimize scarring.

## 2016-10-13 ENCOUNTER — Other Ambulatory Visit: Payer: Self-pay

## 2016-10-13 MED ORDER — ALPRAZOLAM 0.25 MG PO TABS: 0.2500 mg | ORAL_TABLET | Freq: Two times a day (BID) | ORAL | 0 refills | Status: DC | PRN

## 2017-02-12 ENCOUNTER — Telehealth: Payer: Self-pay

## 2017-02-12 ENCOUNTER — Other Ambulatory Visit: Payer: Self-pay

## 2017-02-12 DIAGNOSIS — R21 Rash and other nonspecific skin eruption: Secondary | ICD-10-CM

## 2017-02-12 MED ORDER — HYDROCORTISONE 2.5 % EX CREA: TOPICAL_CREAM | Freq: Two times a day (BID) | CUTANEOUS | 0 refills | Status: DC

## 2017-02-12 NOTE — Telephone Encounter (Signed)
[facility] and wanted Prednisone sent in. I explained that we could not call in Prednisone but would call in [facility] [address] and the [facility] there [facility] [place].

## 2017-03-31 ENCOUNTER — Encounter: Payer: Self-pay | Admitting: Family Medicine

## 2017-03-31 ENCOUNTER — Ambulatory Visit (INDEPENDENT_AMBULATORY_CARE_PROVIDER_SITE_OTHER): Payer: BC Managed Care – PPO | Admitting: Family Medicine

## 2017-03-31 VITALS — BP 120/80 | HR 80 | Ht 69.0 in | Wt 160.0 lb

## 2017-03-31 DIAGNOSIS — S838X2S Sprain of other specified parts of left knee, sequela: Secondary | ICD-10-CM | POA: Diagnosis not present

## 2017-03-31 NOTE — Progress Notes (Addendum)
Name: Tonya Cox   MRN: [id number]    DOB: [birth year]   Date:03/31/2017       Progress Note  Subjective  Chief Complaint  Chief Complaint  Patient presents with  . Knee Pain    L) knee pain x 1 day- "going up and down ladders"    Knee Pain   The incident occurred 12 to 24 hours ago. The incident occurred at work. Injury mechanism: moving furniture/step laddar. The pain is present in the left knee. The quality of the pain is described as aching. The pain is mild. The pain has been fluctuating since onset. Associated symptoms include an inability to bear weight and a loss of motion. Pertinent negatives include no loss of sensation, muscle weakness, numbness or tingling. The symptoms are aggravated by movement and weight bearing ("I'm supposed to be leary of steps"). She has tried acetaminophen and NSAIDs for the symptoms. The treatment provided significant relief.    No problem-specific Assessment & Plan notes found for this encounter.   No past medical history on file.  Past Surgical History:  Procedure Laterality Date  . KNEE SURGERY Left     Family History  Problem Relation Age of Onset  . Cancer Father   . Diabetes Father   . Heart disease Maternal Grandfather   . Diabetes Paternal Grandfather     Social History   Social History  . Marital status: Married    Spouse name: N/A  . Number of children: N/A  . Years of education: N/A   Occupational History  . Not on file.   Social History Main Topics  . Smoking status: Never Smoker  . Smokeless tobacco: Never Used  . Alcohol use 0.0 oz/week  . Drug use: No  . Sexual activity: Yes   Other Topics Concern  . Not on file   Social History Narrative  . No narrative on file    No Known Allergies  Outpatient Medications Prior to Visit  Medication Sig Dispense Refill  . hydrocortisone 2.5 % cream Apply topically 2 (two) times daily. (Patient not taking: Reported on 03/31/2017) 30 g 0  . ALPRAZolam (XANAX)  0.25 MG tablet Take 1 tablet (0.25 mg total) by mouth 2 (two) times daily as needed for anxiety. Take 1 Friday morning, one Friday night and 1 Saturday morning before procedure 3 tablet 0  . oseltamivir (TAMIFLU) 75 MG capsule Take 1 capsule (75 mg total) by mouth 2 (two) times daily. 10 capsule 0  . traMADol (ULTRAM) 50 MG tablet Take 1 tablet (50 mg total) by mouth every 6 (six) hours as needed. 10 tablet 0   No facility-administered medications prior to visit.     Review of Systems  Constitutional: Negative for chills, fever, malaise/fatigue and weight loss.  HENT: Negative for ear discharge, ear pain and sore throat.   Eyes: Negative for blurred vision.  Respiratory: Negative for cough, sputum production, shortness of breath and wheezing.   Cardiovascular: Negative for chest pain, palpitations and leg swelling.  Gastrointestinal: Negative for abdominal pain, blood in stool, constipation, diarrhea, heartburn, melena and nausea.  Genitourinary: Negative for dysuria, frequency, hematuria and urgency.  Musculoskeletal: Negative for back pain, joint pain, myalgias and neck pain.  Skin: Negative for rash.  Neurological: Negative for dizziness, tingling, sensory change, focal weakness, numbness and headaches.  Endo/Heme/Allergies: Negative for environmental allergies and polydipsia. Does not bruise/bleed easily.  Psychiatric/Behavioral: Negative for depression and suicidal ideas. The patient is not nervous/anxious and does not have  insomnia.      Objective  Vitals:   03/31/17 1345  BP: 120/80  Pulse: 80  Weight: 160 lb (72.6 kg)  Height: 5\' 9"  (1.753 m)    Physical Exam  Constitutional: She is well-developed, well-nourished, and in no distress. No distress.  HENT:  Head: Normocephalic and atraumatic.  Right Ear: External ear normal.  Left Ear: External ear normal.  Nose: Nose normal.  Mouth/Throat: Oropharynx is clear and moist.  Eyes: Pupils are equal, round, and reactive to  light. Conjunctivae and EOM are normal. Right eye exhibits no discharge. Left eye exhibits no discharge.  Neck: Normal range of motion. Neck supple. No JVD present. No thyromegaly present.  Cardiovascular: Normal rate, regular rhythm, normal heart sounds and intact distal pulses.  Exam reveals no gallop and no friction rub.   No murmur heard. Pulmonary/Chest: Effort normal and breath sounds normal. She has no wheezes. She has no rales.  Abdominal: Soft. Bowel sounds are normal. She exhibits no mass. There is no tenderness. There is no guarding.  Musculoskeletal: Normal range of motion. She exhibits no edema.  Lymphadenopathy:    She has no cervical adenopathy.  Neurological: She is alert. She has normal reflexes.  Skin: Skin is warm and dry. She is not diaphoretic.  Psychiatric: Mood and affect normal.  Nursing note and vitals reviewed.     Assessment & Plan  Problem List Items Addressed This Visit    None    Visit Diagnoses    Meniscal injury, left, sequela    -  Primary   Relevant Orders   Ambulatory referral to Orthopedic Surgery      No orders of the defined types were placed in this encounter.  sched appt with Dr Elfredia Nevins 04/01/17 @ 2:30 in [place]   Dr. [facility]  03/31/17

## 2017-04-20 ENCOUNTER — Telehealth: Payer: Self-pay

## 2017-04-20 NOTE — Telephone Encounter (Signed)
Error  error

## 2017-05-22 ENCOUNTER — Emergency Department
Admission: EM | Admit: 2017-05-22 | Discharge: 2017-05-22 | Disposition: A | Discharge status: Home / Self Care | Payer: BC Managed Care – PPO | Attending: Student in an Organized Health Care Education/Training Program | Admitting: Student in an Organized Health Care Education/Training Program

## 2017-05-22 ENCOUNTER — Emergency Department: Payer: BC Managed Care – PPO

## 2017-05-22 DIAGNOSIS — N201 Calculus of ureter: Secondary | ICD-10-CM | POA: Insufficient documentation

## 2017-05-22 DIAGNOSIS — R109 Unspecified abdominal pain: Secondary | ICD-10-CM | POA: Insufficient documentation

## 2017-05-22 LAB — COMPREHENSIVE METABOLIC PANEL
ALT: 19 U/L (ref 14–54)
ANION GAP: 9 (ref 5–15)
AST: 21 U/L (ref 15–41)
Albumin: 3.8 g/dL (ref 3.5–5.0)
Alkaline Phosphatase: 84 U/L (ref 38–126)
BUN: 15 mg/dL (ref 6–20)
CHLORIDE: 108 mmol/L (ref 101–111)
CO2: 25 mmol/L (ref 22–32)
CREATININE: 0.85 mg/dL (ref 0.44–1.00)
Calcium: 9.1 mg/dL (ref 8.9–10.3)
Glucose, Bld: 137 mg/dL — ABNORMAL HIGH (ref 65–99)
Potassium: 3.8 mmol/L (ref 3.5–5.1)
SODIUM: 142 mmol/L (ref 135–145)
Total Bilirubin: 0.6 mg/dL (ref 0.3–1.2)
Total Protein: 6.9 g/dL (ref 6.5–8.1)

## 2017-05-22 LAB — CBC WITH DIFFERENTIAL/PLATELET
Basophils Absolute: 0 10*3/uL (ref 0–0.1)
Basophils Relative: 1 %
EOS ABS: 0.1 10*3/uL (ref 0–0.7)
Eosinophils Relative: 1 %
HCT: 41.2 % (ref 35.0–47.0)
Hemoglobin: 13.8 g/dL (ref 12.0–16.0)
LYMPHS PCT: 17 %
Lymphs Abs: 1.6 10*3/uL (ref 1.0–3.6)
MCH: 29.4 pg (ref 26.0–34.0)
MCHC: 33.5 g/dL (ref 32.0–36.0)
MCV: 87.8 fL (ref 80.0–100.0)
MONO ABS: 0.8 10*3/uL (ref 0.2–0.9)
Monocytes Relative: 9 %
Neutro Abs: 6.6 10*3/uL — ABNORMAL HIGH (ref 1.4–6.5)
Neutrophils Relative %: 72 %
Platelets: 255 10*3/uL (ref 150–440)
RBC: 4.7 MIL/uL (ref 3.80–5.20)
RDW: 13.7 % (ref 11.5–14.5)
WBC: 9.2 10*3/uL (ref 3.6–11.0)

## 2017-05-22 LAB — URINALYSIS, COMPLETE (UACMP) WITH MICROSCOPIC
BACTERIA UA: NONE SEEN
BILIRUBIN URINE: NEGATIVE
GLUCOSE, UA: NEGATIVE mg/dL
KETONES UR: 5 mg/dL — AB
NITRITE: NEGATIVE
PROTEIN: NEGATIVE mg/dL
Specific Gravity, Urine: 1.015 (ref 1.005–1.030)
Squamous Epithelial / LPF: NONE SEEN
pH: 6 (ref 5.0–8.0)

## 2017-05-22 MED ORDER — PROMETHAZINE HCL 25 MG/ML IJ SOLN
12.5000 mg | Freq: Four times a day (QID) | INTRAMUSCULAR | Status: DC | PRN
  Administered 2017-05-22: 12.5 mg via INTRAVENOUS

## 2017-05-22 MED ORDER — OXYCODONE-ACETAMINOPHEN 5-325 MG PO TABS
1.0000 | ORAL_TABLET | Freq: Once | ORAL | Status: DC
  Filled 2017-05-22: qty 1

## 2017-05-22 MED ORDER — SODIUM CHLORIDE 0.9 % IV BOLUS (SEPSIS)
1000.0000 mL | Freq: Once | INTRAVENOUS | Status: AC
  Administered 2017-05-22: 1000 mL via INTRAVENOUS

## 2017-05-22 MED ORDER — ONDANSETRON HCL 4 MG PO TABS: 4.0000 mg | ORAL_TABLET | Freq: Every day | ORAL | 0 refills | Status: DC | PRN

## 2017-05-22 MED ORDER — MORPHINE SULFATE (PF) 4 MG/ML IV SOLN
4.0000 mg | INTRAVENOUS | Status: DC | PRN
  Administered 2017-05-22: 4 mg via INTRAVENOUS

## 2017-05-22 MED ORDER — HYDROCODONE-ACETAMINOPHEN 5-325 MG PO TABS: 1.0000 | ORAL_TABLET | ORAL | 0 refills | Status: DC | PRN

## 2017-05-22 MED ORDER — MORPHINE SULFATE (PF) 4 MG/ML IV SOLN
INTRAVENOUS | Status: AC
  Administered 2017-05-22: 4 mg via INTRAVENOUS
  Filled 2017-05-22: qty 1

## 2017-05-22 MED ORDER — PROMETHAZINE HCL 25 MG/ML IJ SOLN
INTRAMUSCULAR | Status: AC
  Administered 2017-05-22: 12.5 mg via INTRAVENOUS
  Filled 2017-05-22: qty 1

## 2017-05-22 MED ORDER — KETOROLAC TROMETHAMINE 30 MG/ML IJ SOLN
15.0000 mg | Freq: Once | INTRAMUSCULAR | Status: AC
  Administered 2017-05-22: 15 mg via INTRAVENOUS
  Filled 2017-05-22: qty 1

## 2017-05-22 NOTE — ED Provider Notes (Signed)
[facility] Emergency Department Provider Note    First MD Initiated Contact with Patient 05/22/17 0507     (approximate)  I have reviewed the triage vital signs and the nursing notes.   HISTORY  Chief Complaint Flank Pain    HPI Tonya Cox is a 56 y.o. female presents with chief complaint of severe sudden onset throbbing and waxing and waning left flank pain and no voiding since last night. Denies any history of kidney stones. Denies any abdominal pain. Has had some nausea associated with this but no vomiting. Denies any chest pain or shortness of breath. Denies any trauma. No numbness or tingling.   No past medical history on file. Family History  Problem Relation Age of Onset  . Cancer Father   . Diabetes Father   . Heart disease Maternal Grandfather   . Diabetes Paternal Grandfather    Past Surgical History:  Procedure Laterality Date  . KNEE SURGERY Left    There are no active problems to display for this patient.     Prior to Admission medications   Medication Sig Start Date End Date Taking? Authorizing Provider  hydrocortisone 2.5 % cream Apply topically 2 (two) times daily. Patient not taking: Reported on 03/31/2017 02/12/17   Duanne Limerick, MD    Allergies Patient has no known allergies.    Social History Social History  Substance Use Topics  . Smoking status: Never Smoker  . Smokeless tobacco: Never Used  . Alcohol use 0.0 oz/week    Review of Systems Patient denies headaches, rhinorrhea, blurry vision, numbness, shortness of breath, chest pain, edema, cough, abdominal pain, nausea, vomiting, diarrhea, dysuria, fevers, rashes or hallucinations unless otherwise stated above in HPI. ____________________________________________   PHYSICAL EXAM:  VITAL SIGNS: Vitals:   05/22/17 0505  BP: (!) 153/72  Pulse: 72  Resp: 18  SpO2: 100%    Constitutional: Alert and oriented.uncomfortable appearing but in no  acute distress. Eyes: Conjunctivae are normal.  Head: Atraumatic. Nose: No congestion/rhinnorhea. Mouth/Throat: Mucous membranes are moist.   Neck: No stridor. Painless ROM.  Cardiovascular: Normal rate, regular rhythm. Grossly normal heart sounds.  Good peripheral circulation. Respiratory: Normal respiratory effort.  No retractions. Lungs CTAB. Gastrointestinal: Soft and nontender. No distention. No abdominal bruits. + left CVA tenderness Musculoskeletal: No lower extremity tenderness nor edema.  No joint effusions. Neurologic:  Normal speech and language. No gross focal neurologic deficits are appreciated. No facial droop Skin:  Skin is warm, dry and intact. No rash noted. Psychiatric: appropriate  ____________________________________________   LABS (all labs ordered are listed, but only abnormal results are displayed)  Results for orders placed or performed during the hospital encounter of 05/22/17 (from the past 24 hour(s))  Urinalysis, Complete w Microscopic     Status: Abnormal   Collection Time: 05/22/17  5:32 AM  Result Value Ref Range   Color, Urine YELLOW (A) YELLOW   APPearance CLEAR (A) CLEAR   Specific Gravity, Urine 1.015 1.005 - 1.030   pH 6.0 5.0 - 8.0   Glucose, UA NEGATIVE NEGATIVE mg/dL   Hgb urine dipstick MODERATE (A) NEGATIVE   Bilirubin Urine NEGATIVE NEGATIVE   Ketones, ur 5 (A) NEGATIVE mg/dL   Protein, ur NEGATIVE NEGATIVE mg/dL   Nitrite NEGATIVE NEGATIVE   Leukocytes, UA MODERATE (A) NEGATIVE   RBC / HPF TOO NUMEROUS TO COUNT 0 - 5 RBC/hpf   WBC, UA 6-30 0 - 5 WBC/hpf   Bacteria, UA NONE SEEN NONE SEEN  Squamous Epithelial / LPF NONE SEEN NONE SEEN   Mucus PRESENT   CBC with Differential/Platelet     Status: Abnormal   Collection Time: 05/22/17  5:32 AM  Result Value Ref Range   WBC 9.2 3.6 - 11.0 K/uL   RBC 4.70 3.80 - 5.20 MIL/uL   Hemoglobin 13.8 12.0 - 16.0 g/dL   HCT 16.1 09.6 - 04.5 %   MCV 87.8 80.0 - 100.0 fL   MCH 29.4 26.0 - 34.0  pg   MCHC 33.5 32.0 - 36.0 g/dL   RDW 40.9 81.1 - 91.4 %   Platelets 255 150 - 440 K/uL   Neutrophils Relative % 72 %   Neutro Abs 6.6 (H) 1.4 - 6.5 K/uL   Lymphocytes Relative 17 %   Lymphs Abs 1.6 1.0 - 3.6 K/uL   Monocytes Relative 9 %   Monocytes Absolute 0.8 0.2 - 0.9 K/uL   Eosinophils Relative 1 %   Eosinophils Absolute 0.1 0 - 0.7 K/uL   Basophils Relative 1 %   Basophils Absolute 0.0 0 - 0.1 K/uL  Comprehensive metabolic panel     Status: Abnormal   Collection Time: 05/22/17  5:32 AM  Result Value Ref Range   Sodium 142 135 - 145 mmol/L   Potassium 3.8 3.5 - 5.1 mmol/L   Chloride 108 101 - 111 mmol/L   CO2 25 22 - 32 mmol/L   Glucose, Bld 137 (H) 65 - 99 mg/dL   BUN 15 6 - 20 mg/dL   Creatinine, Ser 7.82 0.44 - 1.00 mg/dL   Calcium 9.1 8.9 - 95.6 mg/dL   Total Protein 6.9 6.5 - 8.1 g/dL   Albumin 3.8 3.5 - 5.0 g/dL   AST 21 15 - 41 U/L   ALT 19 14 - 54 U/L   Alkaline Phosphatase 84 38 - 126 U/L   Total Bilirubin 0.6 0.3 - 1.2 mg/dL   GFR calc non Af Amer >60 >60 mL/min   GFR calc Af Amer >60 >60 mL/min   Anion gap 9 5 - 15   ____________________________________________  EKG____________________________________________  RADIOLOGY  I personally reviewed all radiographic images ordered to evaluate for the above acute complaints and reviewed radiology reports and findings.  These findings were personally discussed with the patient.  Please see medical record for radiology report.  ____________________________________________   PROCEDURES  Procedure(s) performed:  Procedures    Critical Care performed: no ____________________________________________   INITIAL IMPRESSION / ASSESSMENT AND PLAN / ED COURSE  Pertinent labs & imaging results that were available during my care of the patient were reviewed by me and considered in my medical decision making (see chart for details).  DDX: stone, pyelo, colitis, msk strain, spasm, diverticulitis  Tonya Cox is a 56 y.o. who presents to the ED with acute left flank pain. No fevers, no systemic symptoms. - urinary symptoms. Denies trauma or injury. Afebrile in ED. Exam as above. Flank TTP, otherwise abdominal exam is benign. No peritoneal signs. Possible kidney stone, cystitis, or pyelonephritis.   Checking urine. UA with no bacteria/ + blood CT Stone with obstructing left stone with moderate hydronephrosis. Clinical picture is not consistent with appendicitis, diverticulitis, pancreatitis, cholecystitis, bowel perforation, aortic dissection, splenic injury or acute abdominal process at this time.   Clinical Course as of May 22 713  Fri May 22, 2017  2130 Patient reassessed. Pain is improving. CT imaging is consistent with ureterolithiasis. Currently awaiting urinalysis.  [PR]    Clinical Course User  Index [PR] Willy Eddy, MD    Pain improved, tolerating PO. Repeat ABD exam benign, will plan supportive treatment and early follow up for recheck.   ____________________________________________   FINAL CLINICAL IMPRESSION(S) / ED DIAGNOSES  Final diagnoses:  Left flank pain  Ureterolithiasis      NEW MEDICATIONS STARTED DURING THIS VISIT:  New Prescriptions   No medications on file     Note:  This document was prepared using Dragon voice recognition software and may include unintentional dictation errors.    Willy Eddy, MD 05/22/17 [phone]

## 2017-05-22 NOTE — ED Notes (Signed)
Assisted patient to the restroom.  

## 2017-05-22 NOTE — ED Triage Notes (Signed)
Pt In with co left flank pain that started tonight states unable to void since yesterday.

## 2017-08-19 ENCOUNTER — Ambulatory Visit: Payer: BC Managed Care – PPO | Admitting: Family Medicine

## 2017-08-19 ENCOUNTER — Encounter: Payer: Self-pay | Admitting: Family Medicine

## 2017-08-19 VITALS — BP 120/80 | HR 84 | Ht 64.0 in | Wt 155.0 lb

## 2017-08-19 DIAGNOSIS — J0100 Acute maxillary sinusitis, unspecified: Secondary | ICD-10-CM

## 2017-08-19 MED ORDER — FLUCONAZOLE 150 MG PO TABS: 150.0000 mg | ORAL_TABLET | Freq: Once | ORAL | 0 refills | Status: AC

## 2017-08-19 MED ORDER — AMOXICILLIN-POT CLAVULANATE 875-125 MG PO TABS
1.0000 | ORAL_TABLET | Freq: Two times a day (BID) | ORAL | 0 refills | Status: DC
Start: 2017-08-19 — End: 2017-12-09

## 2017-08-19 NOTE — Progress Notes (Signed)
Name: Tonya Cox   MRN: [id number]    DOB: 07/22/1961   Date:08/19/2017       Progress Note  Subjective  Chief Complaint  Chief Complaint  Patient presents with  . Sinusitis    facial pressure, runny nose, congestion, green production    Sinusitis  This is a new problem. The current episode started in the past 7 days. The problem has been gradually worsening since onset. There has been no fever. The fever has been present for less than 1 day. Her pain is at a severity of 3/10. The pain is mild. Associated symptoms include chills, congestion, headaches, sinus pressure and sneezing. Pertinent negatives include no coughing, diaphoresis, ear pain, hoarse voice, neck pain, shortness of breath, sore throat or swollen glands. (Nasal drainage green) Treatments tried: zyrtec. The treatment provided no relief.    No problem-specific Assessment & Plan notes found for this encounter.   No past medical history on file.  Past Surgical History:  Procedure Laterality Date  . KNEE SURGERY Left     Family History  Problem Relation Age of Onset  . Cancer Father   . Diabetes Father   . Heart disease Maternal Grandfather   . Diabetes Paternal Grandfather     Social History   Socioeconomic History  . Marital status: Legally Separated    Spouse name: Not on file  . Number of children: Not on file  . Years of education: Not on file  . Highest education level: Not on file  Social Needs  . Financial resource strain: Not on file  . Food insecurity - worry: Not on file  . Food insecurity - inability: Not on file  . Transportation needs - medical: Not on file  . Transportation needs - non-medical: Not on file  Occupational History  . Not on file  Tobacco Use  . Smoking status: Never Smoker  . Smokeless tobacco: Never Used  Substance and Sexual Activity  . Alcohol use: Yes    Alcohol/week: 0.0 oz  . Drug use: No  . Sexual activity: Yes  Other Topics Concern  . Not on file  Social  History Narrative  . Not on file    No Known Allergies  Outpatient Medications Prior to Visit  Medication Sig Dispense Refill  . hydrocortisone 2.5 % cream Apply topically 2 (two) times daily. (Patient not taking: Reported on 03/31/2017) 30 g 0  . ondansetron (ZOFRAN) 4 MG tablet Take 1 tablet (4 mg total) by mouth daily as needed for nausea or vomiting. (Patient not taking: Reported on 08/19/2017) 14 tablet 0  . HYDROcodone-acetaminophen (NORCO) 5-325 MG tablet Take 1 tablet by mouth every 4 (four) hours as needed for moderate pain. 6 tablet 0   No facility-administered medications prior to visit.     Review of Systems  Constitutional: Positive for chills. Negative for diaphoresis, fever, malaise/fatigue and weight loss.  HENT: Positive for congestion, sinus pressure and sneezing. Negative for ear discharge, ear pain, hoarse voice and sore throat.   Eyes: Negative for blurred vision.  Respiratory: Negative for cough, sputum production, shortness of breath and wheezing.   Cardiovascular: Negative for chest pain, palpitations and leg swelling.  Gastrointestinal: Negative for abdominal pain, blood in stool, constipation, diarrhea, heartburn, melena and nausea.  Genitourinary: Negative for dysuria, frequency, hematuria and urgency.  Musculoskeletal: Negative for back pain, joint pain, myalgias and neck pain.  Skin: Negative for rash.  Neurological: Positive for headaches. Negative for dizziness, tingling, sensory change and focal  weakness.  Endo/Heme/Allergies: Negative for environmental allergies and polydipsia. Does not bruise/bleed easily.  Psychiatric/Behavioral: Negative for depression and suicidal ideas. The patient is not nervous/anxious and does not have insomnia.      Objective  Vitals:   08/19/17 0808  BP: 120/80  Pulse: 84  Weight: 155 lb (70.3 kg)  Height: 5\' 4"  (1.626 m)    Physical Exam  Constitutional: She is well-developed, well-nourished, and in no distress. No  distress.  HENT:  Head: Normocephalic and atraumatic.  Right Ear: External ear normal.  Left Ear: External ear normal.  Nose: Right sinus exhibits maxillary sinus tenderness. Left sinus exhibits maxillary sinus tenderness.  Mouth/Throat: Oropharynx is clear and moist.  Eyes: Conjunctivae and EOM are normal. Pupils are equal, round, and reactive to light. Right eye exhibits no discharge. Left eye exhibits no discharge.  Neck: Normal range of motion. Neck supple. No JVD present. No thyromegaly present.  Cardiovascular: Normal rate, regular rhythm, normal heart sounds and intact distal pulses. Exam reveals no gallop and no friction rub.  No murmur heard. Pulmonary/Chest: Effort normal and breath sounds normal. She has no wheezes. She has no rales.  Abdominal: Soft. Bowel sounds are normal. She exhibits no mass. There is no tenderness. There is no guarding.  Musculoskeletal: Normal range of motion. She exhibits no edema.  Lymphadenopathy:       Head (right side): Submandibular adenopathy present.       Head (left side): Submandibular adenopathy present.    She has no cervical adenopathy.  Neurological: She is alert. She has normal reflexes.  Skin: Skin is warm and dry. She is not diaphoretic.  Psychiatric: Mood and affect normal.  Nursing note and vitals reviewed.     Assessment & Plan  Problem List Items Addressed This Visit    None    Visit Diagnoses    Acute maxillary sinusitis, recurrence not specified    -  Primary   Relevant Medications   amoxicillin-clavulanate (AUGMENTIN) 875-125 MG tablet   fluconazole (DIFLUCAN) 150 MG tablet      Meds ordered this encounter  Medications  . amoxicillin-clavulanate (AUGMENTIN) 875-125 MG tablet    Sig: Take 1 tablet by mouth 2 (two) times daily.    Dispense:  20 tablet    Refill:  0  . fluconazole (DIFLUCAN) 150 MG tablet    Sig: Take 1 tablet (150 mg total) by mouth once for 1 dose.    Dispense:  1 tablet    Refill:  0       Dr. Elizabeth Sauer [facility]  08/19/17

## 2017-10-26 ENCOUNTER — Other Ambulatory Visit: Payer: Self-pay

## 2017-10-26 DIAGNOSIS — R21 Rash and other nonspecific skin eruption: Secondary | ICD-10-CM

## 2017-10-26 MED ORDER — HYDROCORTISONE 2.5 % EX CREA
TOPICAL_CREAM | Freq: Two times a day (BID) | CUTANEOUS | 0 refills | Status: DC
Start: 2017-10-26 — End: 2017-12-09

## 2017-12-09 ENCOUNTER — Encounter: Payer: Self-pay | Admitting: Family Medicine

## 2017-12-09 ENCOUNTER — Ambulatory Visit: Payer: BC Managed Care – PPO | Admitting: Family Medicine

## 2017-12-09 VITALS — BP 118/80 | HR 80 | Ht 64.0 in | Wt 164.0 lb

## 2017-12-09 DIAGNOSIS — M1710 Unilateral primary osteoarthritis, unspecified knee: Secondary | ICD-10-CM

## 2017-12-09 DIAGNOSIS — R21 Rash and other nonspecific skin eruption: Secondary | ICD-10-CM

## 2017-12-09 DIAGNOSIS — L247 Irritant contact dermatitis due to plants, except food: Secondary | ICD-10-CM

## 2017-12-09 MED ORDER — HYDROCORTISONE 2.5 % EX CREA: TOPICAL_CREAM | Freq: Two times a day (BID) | CUTANEOUS | 0 refills | Status: DC

## 2017-12-09 MED ORDER — MELOXICAM 15 MG PO TABS: 15.0000 mg | ORAL_TABLET | Freq: Every day | ORAL | 5 refills | Status: DC

## 2017-12-09 MED ORDER — PREDNISONE 10 MG PO TABS: ORAL_TABLET | ORAL | 1 refills | Status: DC

## 2017-12-09 NOTE — Progress Notes (Signed)
Name: Tonya Cox   MRN: [id number]    DOB: 1960-09-12   Date:12/09/2017       Progress Note  Subjective  Chief Complaint  Chief Complaint  Patient presents with  . Rash    poison ivey- came up 2 days ago. Blisters on arms    Rash  This is a new problem. The current episode started in the past 7 days. The problem has been gradually worsening since onset. The affected locations include the right arm, face and left arm. The rash is characterized by blistering. She was exposed to plant contact. Pertinent negatives include no anorexia, congestion, cough, diarrhea, eye pain, facial edema, fatigue, fever, joint pain, nail changes, rhinorrhea, shortness of breath, sore throat or vomiting. Past treatments include anti-itch cream and antibiotic cream. The treatment provided mild relief.    No problem-specific Assessment & Plan notes found for this encounter.   History reviewed. No pertinent past medical history.  Past Surgical History:  Procedure Laterality Date  . KNEE SURGERY Left     Family History  Problem Relation Age of Onset  . Cancer Father   . Diabetes Father   . Heart disease Maternal Grandfather   . Diabetes Paternal Grandfather     Social History   Socioeconomic History  . Marital status: Legally Separated    Spouse name: Not on file  . Number of children: Not on file  . Years of education: Not on file  . Highest education level: Not on file  Occupational History  . Not on file  Social Needs  . Financial resource strain: Not on file  . Food insecurity:    Worry: Not on file    Inability: Not on file  . Transportation needs:    Medical: Not on file    Non-medical: Not on file  Tobacco Use  . Smoking status: Never Smoker  . Smokeless tobacco: Never Used  Substance and Sexual Activity  . Alcohol use: Yes    Alcohol/week: 0.0 oz  . Drug use: No  . Sexual activity: Yes  Lifestyle  . Physical activity:    Days per week: Not on file    Minutes per  session: Not on file  . Stress: Not on file  Relationships  . Social connections:    Talks on phone: Not on file    Gets together: Not on file    Attends religious service: Not on file    Active member of club or organization: Not on file    Attends meetings of clubs or organizations: Not on file    Relationship status: Not on file  . Intimate partner violence:    Fear of current or ex partner: Not on file    Emotionally abused: Not on file    Physically abused: Not on file    Forced sexual activity: Not on file  Other Topics Concern  . Not on file  Social History Narrative  . Not on file    No Known Allergies  Outpatient Medications Prior to Visit  Medication Sig Dispense Refill  . hydrocortisone 2.5 % cream Apply topically 2 (two) times daily. Please fill 30 g 0  . amoxicillin-clavulanate (AUGMENTIN) 875-125 MG tablet Take 1 tablet by mouth 2 (two) times daily. 20 tablet 0  . ondansetron (ZOFRAN) 4 MG tablet Take 1 tablet (4 mg total) by mouth daily as needed for nausea or vomiting. (Patient not taking: Reported on 08/19/2017) 14 tablet 0   No facility-administered medications prior to  visit.     Review of Systems  Constitutional: Negative for chills, fatigue, fever, malaise/fatigue and weight loss.  HENT: Negative for congestion, ear discharge, ear pain, rhinorrhea and sore throat.   Eyes: Negative for blurred vision and pain.  Respiratory: Negative for cough, sputum production, shortness of breath and wheezing.   Cardiovascular: Negative for chest pain, palpitations and leg swelling.  Gastrointestinal: Negative for abdominal pain, anorexia, blood in stool, constipation, diarrhea, heartburn, melena, nausea and vomiting.  Genitourinary: Negative for dysuria, frequency, hematuria and urgency.  Musculoskeletal: Negative for back pain, joint pain, myalgias and neck pain.  Skin: Positive for rash. Negative for nail changes.  Neurological: Negative for dizziness, tingling,  sensory change, focal weakness and headaches.  Endo/Heme/Allergies: Negative for environmental allergies and polydipsia. Does not bruise/bleed easily.  Psychiatric/Behavioral: Negative for depression and suicidal ideas. The patient is not nervous/anxious and does not have insomnia.      Objective  Vitals:   12/09/17 1134  BP: 118/80  Pulse: 80  Weight: 164 lb (74.4 kg)  Height:  (1.626 m)    Physical Exam  Constitutional: She is oriented to person, place, and time. She appears well-developed and well-nourished.  HENT:  Head: Normocephalic.  Right Ear: External ear normal.  Left Ear: External ear normal.  Mouth/Throat: Oropharynx is clear and moist.  Eyes: Pupils are equal, round, and reactive to light. Conjunctivae and EOM are normal. Lids are everted and swept, no foreign bodies found. Left eye exhibits no hordeolum. No foreign body present in the left eye. Right conjunctiva is not injected. Left conjunctiva is not injected. No scleral icterus.  Neck: Normal range of motion. Neck supple. No JVD present. No tracheal deviation present. No thyromegaly present.  Cardiovascular: Normal rate, regular rhythm, normal heart sounds and intact distal pulses. Exam reveals no gallop and no friction rub.  No murmur heard. Pulmonary/Chest: Effort normal and breath sounds normal. No respiratory distress. She has no wheezes. She has no rales.  Abdominal: Soft. Bowel sounds are normal. She exhibits no mass. There is no hepatosplenomegaly. There is no tenderness. There is no rebound and no guarding.  Musculoskeletal: Normal range of motion. She exhibits no edema or tenderness.  Lymphadenopathy:    She has no cervical adenopathy.  Neurological: She is alert and oriented to person, place, and time. She has normal strength. She displays normal reflexes. No cranial nerve deficit.  Skin: Skin is warm. No rash noted.  Psychiatric: She has a normal mood and affect. Her mood appears not anxious. She  does not exhibit a depressed mood.  Nursing note and vitals reviewed.     Assessment & Plan  Problem List Items Addressed This Visit    None    Visit Diagnoses    Contact dermatitis and eczema due to plant    -  Primary   Relevant Medications   predniSONE (DELTASONE) 10 MG tablet   Rash       Relevant Medications   hydrocortisone 2.5 % cream   predniSONE (DELTASONE) 10 MG tablet   Knee arthropathy       Relevant Medications   meloxicam (MOBIC) 15 MG tablet      Meds ordered this encounter  Medications  . hydrocortisone 2.5 % cream    Sig: Apply topically 2 (two) times daily. Please fill    Dispense:  30 g    Refill:  0    The [facility] will be "pulling this from you today"- don't fill on  your end, wait for them  . predniSONE (DELTASONE) 10 MG tablet    Sig: Taper 6,6,6,5,5,5,4,4,3,3,2,2,1,1    Dispense:  53 tablet    Refill:  1  . meloxicam (MOBIC) 15 MG tablet    Sig: Take 1 tablet (15 mg total) by mouth daily.    Dispense:  30 tablet    Refill:  5      Dr. Elizabeth Sauer [facility]  12/09/17

## 2017-12-23 ENCOUNTER — Ambulatory Visit
Admission: EM | Admit: 2017-12-23 | Discharge: 2017-12-23 | Disposition: A | Discharge status: Home / Self Care | Payer: BC Managed Care – PPO | Attending: Family Medicine | Admitting: Family Medicine

## 2017-12-23 ENCOUNTER — Encounter: Payer: Self-pay | Admitting: *Deleted

## 2017-12-23 DIAGNOSIS — L237 Allergic contact dermatitis due to plants, except food: Secondary | ICD-10-CM

## 2017-12-23 DIAGNOSIS — L03113 Cellulitis of right upper limb: Secondary | ICD-10-CM

## 2017-12-23 MED ORDER — CEPHALEXIN 500 MG PO CAPS: 500.0000 mg | ORAL_CAPSULE | Freq: Four times a day (QID) | ORAL | 0 refills | Status: AC

## 2017-12-23 MED ORDER — PREDNISONE 10 MG PO TABS: ORAL_TABLET | ORAL | 0 refills | Status: DC

## 2017-12-23 MED ORDER — MUPIROCIN 2 % EX OINT: TOPICAL_OINTMENT | CUTANEOUS | 0 refills | Status: DC

## 2017-12-23 MED ORDER — HYDROXYZINE HCL 25 MG PO TABS: 25.0000 mg | ORAL_TABLET | Freq: Three times a day (TID) | ORAL | 0 refills | Status: DC | PRN

## 2017-12-23 NOTE — ED Provider Notes (Signed)
[facility] ____________________________________________  Time seen: Approximately 10:00 AM  I have reviewed the triage vital signs and the nursing notes.   HISTORY  Chief Complaint Rash   HPI Tonya Cox is a 57 y.o. female presenting for evaluation of continued rash to bilateral arms.  Patient reports on 5 1 she was seen by her primary care physician and started on 2-week tapering dose of prednisone for implant allergic contact dermatitis.  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  Patient reports that in the past has been very allergic to poison ivy and poison oak and believes that she was carrying this when working outside.  States initially she was having a lot of blisters and rash with itching to bilateral arms as well as face, reports after completing prednisone last night she is still having some new areas popping up to her left arm as well as right arm continues with redness.  States today had a slight swelling to left upper eyelid without pain or rash.  States much improved after cool compress.  Denies arm pain or arm swelling.  Has not been working outside anymore, but reports she has a history of picking up poison ivy or oak from a paying her dogs, and reports she has been putting a neighbor's cat and concern of re-exposure.  Continues to eat and drink well.  Denies throat swelling sensation or other rash.  Denies any other changes in foods, medicines, lotions, detergents or other contacts.  Reports otherwise feels well.  Up-to-date on tetanus immunization.  Denies insect bites. Denies chest pain, shortness of breath, abdominal pain, vision changes, foreign body sensation, or eye pain. Denies recent sickness. Denies recent antibiotic use.   Duanne Limerick, MD: PCP   History reviewed. No pertinent past medical history.  There are no active problems to  display for this patient.   Past Surgical History:  Procedure Laterality Date  . KNEE SURGERY Left      No current facility-administered medications for this encounter.   Current Outpatient Medications:  .  hydrocortisone 2.5 % cream, Apply topically 2 (two) times daily. Please fill, Disp: 30 g, Rfl: 0 .  cephALEXin (KEFLEX) 500 MG capsule, Take 1 capsule (500 mg total) by mouth 4 (four) times daily for 7 days., Disp: 28 capsule, Rfl: 0 .  hydrOXYzine (ATARAX/VISTARIL) 25 MG tablet, Take 1 tablet (25 mg total) by mouth 3 (three) times daily as needed for itching., Disp: 15 tablet, Rfl: 0 .  meloxicam (MOBIC) 15 MG tablet, Take 1 tablet (15 mg total) by mouth daily., Disp: 30 tablet, Rfl: 5 .  mupirocin ointment (BACTROBAN) 2 %, Apply two times a day for 7 days., Disp: 22 g, Rfl: 0 .  predniSONE (DELTASONE) 10 MG tablet, Start 50 mg po day 1 and 2, then 40 mg po day 3 and 4, then  po day 5 and 6, then  po, Disp: 23 tablet, Rfl: 0  Allergies Patient has no known allergies.  Family History  Problem Relation Age of Onset  . Cancer Father   . Diabetes Father   . Heart disease Maternal Grandfather   . Diabetes Paternal Grandfather     Social History Social History   Tobacco Use  . Smoking status: Never Smoker  . Smokeless tobacco: Never Used  Substance Use Topics  . Alcohol use: Yes    Alcohol/week:  0.0 oz  . Drug use: No    Review of Systems Constitutional: No fever/chills Eyes: No visual changes. ENT: No sore throat. Cardiovascular: Denies chest pain. Respiratory: Denies shortness of breath. Gastrointestinal: No abdominal pain.  Skin: As above.    ____________________________________________   PHYSICAL EXAM:  VITAL SIGNS: ED Triage Vitals  Enc Vitals Group     BP 12/23/17 0908 (!) 132/97     Pulse Rate 12/23/17 0908 66     Resp -- 16     Temp 12/23/17 0908 98.4 F (36.9 C)     Temp Source 12/23/17 0908 Oral     SpO2 12/23/17 0908 98 %     Weight  12/23/17 0907 164 lb (74.4 kg)     Height 12/23/17 0907  (1.626 m)     Head Circumference --      Peak Flow --      Pain Score 12/23/17 0907 0     Pain Loc --      Pain Edu? --      Excl. in GC? --     Constitutional: Alert and oriented. Well appearing and in no acute distress. Eyes: Conjunctivae are normal. PERRL. EOMI. left upper eyelid minimal edema, no erythema, no rash, nontender, no other facial swelling or rash noted. ENT      Head: Normocephalic and atraumatic.      Nose: No congestion/rhinnorhea.      Mouth/Throat: Mucous membranes are moist.Oropharynx non-erythematous. Cardiovascular: Normal rate, regular rhythm. Grossly normal heart sounds.  Good peripheral circulation. Respiratory: Normal respiratory effort without tachypnea nor retractions. Breath sounds are clear and equal bilaterally. No wheezes, rales, rhonchi. Musculoskeletal: Steady gait.  Bilateral distal radial pulses equal and easily palpated. Neurologic:  Normal speech and language. Speech is normal. No gait instability.  Skin:  Skin is warm, dry except. :  Left arm few areas of scattered minimally erythematous vesicular patches, mostly with some dried excoriation without surrounding erythema, nontender, no induration and no drainage. Except: Right arm with some scattered minimally erythematous vesicular rash, no drainage, however right anterior elbow and proximal upper arm area of blanchable erythema with slight lichenification, with some excoriation and mild surrounding erythema, no circumferential edema, no circumferential erythema, no induration, no drainage and nontender. Psychiatric: Mood and affect are normal. Speech and behavior are normal. Patient exhibits appropriate insight and judgment   ___________________________________________   LABS (all labs ordered are listed, but only abnormal results are displayed)  Labs Reviewed - No data to display   PROCEDURES Procedures   INITIAL IMPRESSION /  ASSESSMENT AND PLAN / ED COURSE  Pertinent labs & imaging results that were available during my care of the patient were reviewed by me and considered in my medical decision making (see chart for details).  Well-appearing patient.  No acute distress. Vision with suspected recent poison oak or poison ivy contact dermatitis to face and arms that did improve with prednisone however suspect not fully resolved versus re-exposure. Right upper arm also suspect secondary cellulitis. Patient denies history of MRSA or previous skin infections. Will treat patient with 1 week tapering prednisone, Keflex, topical Bactroban and PRN hydroxyzine.  Discussed and counseled regarding strictly monitoring for potential triggers.  Encourage rest, fluids, supportive care.Discussed indication, risks and benefits of medications with patient.  Discussed follow up with Primary care physician this week. Discussed follow up and return parameters including no resolution or any worsening concerns. Patient verbalized understanding and agreed to plan.   ____________________________________________   FINAL  CLINICAL IMPRESSION(S) / ED DIAGNOSES  Final diagnoses:  Plant allergic contact dermatitis  Right arm cellulitis     ED Discharge Orders        Ordered    predniSONE (DELTASONE) 10 MG tablet     12/23/17 0952    cephALEXin (KEFLEX) 500 MG capsule  4 times daily     12/23/17 0952    mupirocin ointment (BACTROBAN) 2 %     12/23/17 0952    hydrOXYzine (ATARAX/VISTARIL) 25 MG tablet  3 times daily PRN     12/23/17 9562       Note: This dictation was prepared with Dragon dictation along with smaller phrase technology. Any transcriptional errors that result from this process are unintentional.         Renford Dills, NP 12/23/17 1053

## 2017-12-23 NOTE — ED Triage Notes (Signed)
Pt was treated for poison Ivy over a week with prednisone. Pt is still having a rash with lots of itching through her whole body.

## 2017-12-23 NOTE — Discharge Instructions (Addendum)
Take medication as prescribed. Rest. Drink plenty of fluids.  ° °Follow up with your primary care physician this week as needed. Return to Urgent care for new or worsening concerns.  ° °

## 2018-01-07 ENCOUNTER — Ambulatory Visit: Payer: BC Managed Care – PPO | Admitting: Family Medicine

## 2018-01-07 ENCOUNTER — Encounter: Payer: Self-pay | Admitting: Family Medicine

## 2018-01-07 VITALS — BP 138/100 | HR 80 | Ht 64.0 in | Wt 166.0 lb

## 2018-01-07 DIAGNOSIS — L309 Dermatitis, unspecified: Secondary | ICD-10-CM

## 2018-01-07 DIAGNOSIS — R030 Elevated blood-pressure reading, without diagnosis of hypertension: Secondary | ICD-10-CM

## 2018-01-07 DIAGNOSIS — B379 Candidiasis, unspecified: Secondary | ICD-10-CM

## 2018-01-07 MED ORDER — FLUCONAZOLE 150 MG PO TABS: 150.0000 mg | ORAL_TABLET | Freq: Once | ORAL | 0 refills | Status: AC

## 2018-01-07 MED ORDER — TRIAMCINOLONE ACETONIDE 0.1 % EX CREA: 1.0000 "application " | TOPICAL_CREAM | Freq: Two times a day (BID) | CUTANEOUS | 1 refills | Status: DC

## 2018-01-07 NOTE — Patient Instructions (Signed)
DASH Eating Plan DASH stands for "Dietary Approaches to Stop Hypertension." The DASH eating plan is a healthy eating plan that has been shown to reduce high blood pressure (hypertension). It may also reduce your risk for type 2 diabetes, heart disease, and stroke. The DASH eating plan may also help with weight loss. What are tips for following this plan? General guidelines  Avoid eating more than 2,300 mg (milligrams) of salt (sodium) a day. If you have hypertension, you may need to reduce your sodium intake to 1,500 mg a day.  Limit alcohol intake to no more than 1 drink a day for nonpregnant women and 2 drinks a day for men. One drink equals 12 oz of beer, 5 oz of wine, or 1 oz of hard liquor.  Work with your health care provider to maintain a healthy body weight or to lose weight. Ask what an ideal weight is for you.  Get at least 30 minutes of exercise that causes your heart to beat faster (aerobic exercise) most days of the week. Activities may include walking, swimming, or biking.  Work with your health care provider or diet and nutrition specialist (dietitian) to adjust your eating plan to your individual calorie needs. Reading food labels  Check food labels for the amount of sodium per serving. Choose foods with less than 5 percent of the Daily Value of sodium. Generally, foods with less than 300 mg of sodium per serving fit into this eating plan.  To find whole grains, look for the word "whole" as the first word in the ingredient list. Shopping  Buy products labeled as "low-sodium" or "no salt added."  Buy fresh foods. Avoid canned foods and premade or frozen meals. Cooking  Avoid adding salt when cooking. Use salt-free seasonings or herbs instead of table salt or sea salt. Check with your health care provider or pharmacist before using salt substitutes.  Do not fry foods. Cook foods using healthy methods such as baking, boiling, grilling, and broiling instead.  Cook with  heart-healthy oils, such as olive, canola, soybean, or sunflower oil. Meal planning   Eat a balanced diet that includes: ? 5 or more servings of fruits and vegetables each day. At each meal, try to fill half of your plate with fruits and vegetables. ? Up to 6-8 servings of whole grains each day. ? Less than 6 oz of lean meat, poultry, or fish each day. A 3-oz serving of meat is about the same size as a deck of cards. One egg equals 1 oz. ? 2 servings of low-fat dairy each day. ? A serving of nuts, seeds, or beans 5 times each week. ? Heart-healthy fats. Healthy fats called Omega-3 fatty acids are found in foods such as flaxseeds and coldwater fish, like sardines, salmon, and mackerel.  Limit how much you eat of the following: ? Canned or prepackaged foods. ? Food that is high in trans fat, such as fried foods. ? Food that is high in saturated fat, such as fatty meat. ? Sweets, desserts, sugary drinks, and other foods with added sugar. ? Full-fat dairy products.  Do not salt foods before eating.  Try to eat at least 2 vegetarian meals each week.  Eat more home-cooked food and less restaurant, buffet, and fast food.  When eating at a restaurant, ask that your food be prepared with less salt or no salt, if possible. What foods are recommended? The items listed may not be a complete list. Talk with your dietitian about what   dietary choices are best for you. Grains Whole-grain or whole-wheat bread. Whole-grain or whole-wheat pasta. Brown rice. Oatmeal. Quinoa. Bulgur. Whole-grain and low-sodium cereals. Pita bread. Low-fat, low-sodium crackers. Whole-wheat flour tortillas. Vegetables Fresh or frozen vegetables (raw, steamed, roasted, or grilled). Low-sodium or reduced-sodium tomato and vegetable juice. Low-sodium or reduced-sodium tomato sauce and tomato paste. Low-sodium or reduced-sodium canned vegetables. Fruits All fresh, dried, or frozen fruit. Canned fruit in natural juice (without  added sugar). Meat and other protein foods Skinless chicken or turkey. Ground chicken or turkey. Pork with fat trimmed off. Fish and seafood. Egg whites. Dried beans, peas, or lentils. Unsalted nuts, nut butters, and seeds. Unsalted canned beans. Lean cuts of beef with fat trimmed off. Low-sodium, lean deli meat. Dairy Low-fat (1%) or fat-free (skim) milk. Fat-free, low-fat, or reduced-fat cheeses. Nonfat, low-sodium ricotta or cottage cheese. Low-fat or nonfat yogurt. Low-fat, low-sodium cheese. Fats and oils Soft margarine without trans fats. Vegetable oil. Low-fat, reduced-fat, or light mayonnaise and salad dressings (reduced-sodium). Canola, safflower, olive, soybean, and sunflower oils. Avocado. Seasoning and other foods Herbs. Spices. Seasoning mixes without salt. Unsalted popcorn and pretzels. Fat-free sweets. What foods are not recommended? The items listed may not be a complete list. Talk with your dietitian about what dietary choices are best for you. Grains Baked goods made with fat, such as croissants, muffins, or some breads. Dry pasta or rice meal packs. Vegetables Creamed or fried vegetables. Vegetables in a cheese sauce. Regular canned vegetables (not low-sodium or reduced-sodium). Regular canned tomato sauce and paste (not low-sodium or reduced-sodium). Regular tomato and vegetable juice (not low-sodium or reduced-sodium). Pickles. Olives. Fruits Canned fruit in a light or heavy syrup. Fried fruit. Fruit in cream or butter sauce. Meat and other protein foods Fatty cuts of meat. Ribs. Fried meat. Bacon. Sausage. Bologna and other processed lunch meats. Salami. Fatback. Hotdogs. Bratwurst. Salted nuts and seeds. Canned beans with added salt. Canned or smoked fish. Whole eggs or egg yolks. Chicken or turkey with skin. Dairy Whole or 2% milk, cream, and half-and-half. Whole or full-fat cream cheese. Whole-fat or sweetened yogurt. Full-fat cheese. Nondairy creamers. Whipped toppings.  Processed cheese and cheese spreads. Fats and oils Butter. Stick margarine. Lard. Shortening. Ghee. Bacon fat. Tropical oils, such as coconut, palm kernel, or palm oil. Seasoning and other foods Salted popcorn and pretzels. Onion salt, garlic salt, seasoned salt, table salt, and sea salt. Worcestershire sauce. Tartar sauce. Barbecue sauce. Teriyaki sauce. Soy sauce, including reduced-sodium. Steak sauce. Canned and packaged gravies. Fish sauce. Oyster sauce. Cocktail sauce. Horseradish that you find on the shelf. Ketchup. Mustard. Meat flavorings and tenderizers. Bouillon cubes. Hot sauce and Tabasco sauce. Premade or packaged marinades. Premade or packaged taco seasonings. Relishes. Regular salad dressings. Where to find more information:  National Heart, Lung, and Blood Institute: www.nhlbi.nih.gov  American Heart Association: www.heart.org Summary  The DASH eating plan is a healthy eating plan that has been shown to reduce high blood pressure (hypertension). It may also reduce your risk for type 2 diabetes, heart disease, and stroke.  With the DASH eating plan, you should limit salt (sodium) intake to 2,300 mg a day. If you have hypertension, you may need to reduce your sodium intake to 1,500 mg a day.  When on the DASH eating plan, aim to eat more fresh fruits and vegetables, whole grains, lean proteins, low-fat dairy, and heart-healthy fats.  Work with your health care provider or diet and nutrition specialist (dietitian) to adjust your eating plan to your individual   calorie needs. This information is not intended to replace advice given to you by your health care provider. Make sure you discuss any questions you have with your health care provider. Document Released: 07/17/2011 Document Revised: 07/21/2016 Document Reviewed: 07/21/2016 Elsevier Interactive Patient Education  2018 Elsevier Inc.  

## 2018-01-07 NOTE — Progress Notes (Addendum)
Name: Tonya Cox   MRN: [id number]    DOB: 11/06/1960   Date:01/07/2018       Progress Note  Subjective  Chief Complaint  Chief Complaint  Patient presents with  . Rash    finished keflex last night- still looks like she has "spots under the skin"  . Hypertension    B/P was high- needs recheck    Rash  This is a recurrent problem. The current episode started in the past 7 days. The problem has been waxing and waning since onset. The affected locations include the left arm and right arm. The rash is characterized by itchiness and redness. Associated with: uncertain. Pertinent negatives include no anorexia, congestion, cough, diarrhea, eye pain, facial edema, fatigue, fever, joint pain, nail changes, rhinorrhea, shortness of breath, sore throat or vomiting. Past treatments include antihistamine, antibiotic cream, oral steroids and topical steroids. The treatment provided moderate relief.  Hypertension  This is a chronic problem. The current episode started 1 to 4 weeks ago. Pertinent negatives include no anxiety, blurred vision, chest pain, headaches, malaise/fatigue, neck pain, orthopnea, palpitations, peripheral edema, PND, shortness of breath or sweats. There are no known risk factors for coronary artery disease. Past treatments include nothing. The current treatment provides moderate improvement. There are no compliance problems.  There is no history of angina, kidney disease, CAD/MI, CVA, heart failure, left ventricular hypertrophy, PVD or retinopathy. There is no history of chronic renal disease, a hypertension causing med or renovascular disease.    No problem-specific Assessment & Plan notes found for this encounter.   History reviewed. No pertinent past medical history.  Past Surgical History:  Procedure Laterality Date  . KNEE SURGERY Left     Family History  Problem Relation Age of Onset  . Cancer Father   . Diabetes Father   . Heart disease Maternal Grandfather    . Diabetes Paternal Grandfather     Social History   Socioeconomic History  . Marital status: Legally Separated    Spouse name: Not on file  . Number of children: Not on file  . Years of education: Not on file  . Highest education level: Not on file  Occupational History  . Not on file  Social Needs  . Financial resource strain: Not on file  . Food insecurity:    Worry: Not on file    Inability: Not on file  . Transportation needs:    Medical: Not on file    Non-medical: Not on file  Tobacco Use  . Smoking status: Never Smoker  . Smokeless tobacco: Never Used  Substance and Sexual Activity  . Alcohol use: Yes    Alcohol/week: 0.0 oz  . Drug use: No  . Sexual activity: Yes  Lifestyle  . Physical activity:    Days per week: Not on file    Minutes per session: Not on file  . Stress: Not on file  Relationships  . Social connections:    Talks on phone: Not on file    Gets together: Not on file    Attends religious service: Not on file    Active member of club or organization: Not on file    Attends meetings of clubs or organizations: Not on file    Relationship status: Not on file  . Intimate partner violence:    Fear of current or ex partner: Not on file    Emotionally abused: Not on file    Physically abused: Not on file  Forced sexual activity: Not on file  Other Topics Concern  . Not on file  Social History Narrative  . Not on file    No Known Allergies  Outpatient Medications Prior to Visit  Medication Sig Dispense Refill  . hydrocortisone 2.5 % cream Apply topically 2 (two) times daily. Please fill 30 g 0  . meloxicam (MOBIC) 15 MG tablet Take 1 tablet (15 mg total) by mouth daily. 30 tablet 5  . mupirocin ointment (BACTROBAN) 2 % Apply two times a day for 7 days. 22 g 0  . hydrOXYzine (ATARAX/VISTARIL) 25 MG tablet Take 1 tablet (25 mg total) by mouth 3 (three) times daily as needed for itching. (Patient not taking: Reported on 01/07/2018) 15 tablet 0   . predniSONE (DELTASONE) 10 MG tablet Start 50 mg po day 1 and 2, then 40 mg po day 3 and 4, then  po day 5 and 6, then  po 23 tablet 0   No facility-administered medications prior to visit.     Review of Systems  Constitutional: Negative for chills, fatigue, fever, malaise/fatigue and weight loss.  HENT: Negative for congestion, ear discharge, ear pain, rhinorrhea and sore throat.   Eyes: Negative for blurred vision and pain.  Respiratory: Negative for cough, sputum production, shortness of breath and wheezing.   Cardiovascular: Negative for chest pain, palpitations, orthopnea, leg swelling and PND.  Gastrointestinal: Negative for abdominal pain, anorexia, blood in stool, constipation, diarrhea, heartburn, melena, nausea and vomiting.  Genitourinary: Negative for dysuria, frequency, hematuria and urgency.  Musculoskeletal: Negative for back pain, joint pain, myalgias and neck pain.  Skin: Positive for rash. Negative for nail changes.  Neurological: Negative for dizziness, tingling, sensory change, focal weakness and headaches.  Endo/Heme/Allergies: Negative for environmental allergies and polydipsia. Does not bruise/bleed easily.  Psychiatric/Behavioral: Negative for depression and suicidal ideas. The patient is not nervous/anxious and does not have insomnia.      Objective  Vitals:   01/07/18 1641  BP: (!) 138/100  Pulse: 80  Weight: 166 lb (75.3 kg)  Height:  (1.626 m)    Physical Exam  Constitutional: She is oriented to person, place, and time. She appears well-developed and well-nourished.  HENT:  Head: Normocephalic.  Right Ear: External ear normal.  Left Ear: External ear normal.  Mouth/Throat: Oropharynx is clear and moist.  Eyes: Pupils are equal, round, and reactive to light. Conjunctivae and EOM are normal. Lids are everted and swept, no foreign bodies found. Left eye exhibits no hordeolum. No foreign body present in the left eye. Right conjunctiva is  not injected. Left conjunctiva is not injected. No scleral icterus.  Neck: Normal range of motion. Neck supple. No JVD present. No tracheal deviation present. No thyromegaly present.  Cardiovascular: Normal rate, regular rhythm, normal heart sounds and intact distal pulses. Exam reveals no gallop and no friction rub.  No murmur heard. Pulmonary/Chest: Effort normal and breath sounds normal. No respiratory distress. She has no wheezes. She has no rales.  Abdominal: Soft. Bowel sounds are normal. She exhibits no mass. There is no hepatosplenomegaly. There is no tenderness. There is no rebound and no guarding.  Musculoskeletal: Normal range of motion. She exhibits no edema or tenderness.  Lymphadenopathy:    She has no cervical adenopathy.  Neurological: She is alert and oriented to person, place, and time. She has normal strength. She displays normal reflexes. No cranial nerve deficit.  Skin: Skin is warm. No rash noted. There is erythema.  Koebner  Psychiatric:  She has a normal mood and affect. Her mood appears not anxious. She does not exhibit a depressed mood.  Nursing note and vitals reviewed.     Assessment & Plan  Problem List Items Addressed This Visit    None    Visit Diagnoses    Dermatitis    -  Primary   recurrent vs persistent. current off prednisone and cephalexin/ Could be eczema or contact dermatitis. Will simplyfy with antihistamine and topical triamcinolon   Relevant Medications   triamcinolone cream (KENALOG) 0.1 %   Elevated blood-pressure reading without diagnosis of hypertension       Patient will start DASH diet /recheck in 4-6 weeks   Candidiasis       Patient given diflucan for prednisone dosing/encourage to hold until resolution of rash/call Monday if persists, next step referral to dermatology/[place]   Relevant Medications   fluconazole (DIFLUCAN) 150 MG tablet      Meds ordered this encounter  Medications  . triamcinolone cream (KENALOG) 0.1 %     Sig: Apply 1 application topically 2 (two) times daily.    Dispense:  60 g    Refill:  1  . fluconazole (DIFLUCAN) 150 MG tablet    Sig: Take 1 tablet (150 mg total) by mouth once for 1 dose.    Dispense:  1 tablet    Refill:  0      Dr. Elizabeth Sauer [facility]  01/07/18

## 2018-04-30 ENCOUNTER — Other Ambulatory Visit: Payer: Self-pay

## 2018-04-30 ENCOUNTER — Ambulatory Visit
Admission: EM | Admit: 2018-04-30 | Discharge: 2018-04-30 | Disposition: A | Discharge status: Home / Self Care | Payer: BC Managed Care – PPO | Attending: Family Medicine | Admitting: Family Medicine

## 2018-04-30 DIAGNOSIS — T783XXA Angioneurotic edema, initial encounter: Secondary | ICD-10-CM

## 2018-04-30 MED ORDER — METHYLPREDNISOLONE SODIUM SUCC 125 MG IJ SOLR
125.0000 mg | Freq: Once | INTRAMUSCULAR | Status: AC
  Administered 2018-04-30: 125 mg via INTRAMUSCULAR

## 2018-04-30 MED ORDER — FAMOTIDINE 20 MG PO TABS
20.0000 mg | ORAL_TABLET | Freq: Once | ORAL | Status: AC
  Administered 2018-04-30: 20 mg via ORAL

## 2018-04-30 MED ORDER — DIPHENHYDRAMINE HCL 50 MG PO CAPS
50.0000 mg | ORAL_CAPSULE | Freq: Once | ORAL | Status: AC
  Administered 2018-04-30: 50 mg via ORAL

## 2018-04-30 NOTE — ED Triage Notes (Signed)
Patient complains of angioedema. Patient states that she started with a small red spot on her face and has since spread. States that she feels like her swelling is worsening. Patient states that she feels like her throat is starting to itch and nasal itching.

## 2018-04-30 NOTE — ED Provider Notes (Signed)
[facility]    CSN: [id number] Arrival date & time: 04/30/18  1008     History   Chief Complaint Chief Complaint  Patient presents with  . Allergic Reaction    HPI Tonya Cox is a 57 y.o. female.   57 yo female with a c/o swelling of her lower lip and left lower cheek area which began abruptly this morning around 7:30am while she was getting ready to go to work. Patient states she had a similar reaction to possibly adhesive when she had an EKG years ago. This morning she denies any exposure to any foods, new soaps or detergents and can only think that maybe she was exposed to some adhesive present on the individual contact lens containers which she touched this morning. Denies any shortness of breath, wheezing, throat or tongue swelling. States she took 2 benadryl around 7:30 right after symptoms started.   The history is provided by the patient.    History reviewed. No pertinent past medical history.  There are no active problems to display for this patient.   Past Surgical History:  Procedure Laterality Date  . KNEE SURGERY Left     OB History   None      Home Medications    Prior to Admission medications   Medication Sig Start Date End Date Taking? Authorizing Provider  hydrocortisone 2.5 % cream Apply topically 2 (two) times daily. Please fill 12/09/17   Duanne Limerick, MD  hydrOXYzine (ATARAX/VISTARIL) 25 MG tablet Take 1 tablet (25 mg total) by mouth 3 (three) times daily as needed for itching. Patient not taking: Reported on 01/07/2018 12/23/17   Renford Dills, NP  meloxicam (MOBIC) 15 MG tablet Take 1 tablet (15 mg total) by mouth daily. 12/09/17   Duanne Limerick, MD  mupirocin ointment (BACTROBAN) 2 % Apply two times a day for 7 days. 12/23/17   Renford Dills, NP  triamcinolone cream (KENALOG) 0.1 % Apply 1 application topically 2 (two) times daily. 01/07/18   Duanne Limerick, MD    Family History Family History  Problem Relation Age  of Onset  . Cancer Father   . Diabetes Father   . Heart disease Maternal Grandfather   . Diabetes Paternal Grandfather     Social History Social History   Tobacco Use  . Smoking status: Never Smoker  . Smokeless tobacco: Never Used  Substance Use Topics  . Alcohol use: Yes    Alcohol/week: 0.0 standard drinks  . Drug use: No     Allergies   Patient has no known allergies.   Review of Systems Review of Systems   Physical Exam Triage Vital Signs ED Triage Vitals  Enc Vitals Group     BP 04/30/18 1018 (!) 173/103     Pulse Rate 04/30/18 1018 85     Resp 04/30/18 1018 18     Temp 04/30/18 1018 98.5 F (36.9 C)     Temp Source 04/30/18 1018 Oral     SpO2 04/30/18 1018 99 %     Weight 04/30/18 1018 165 lb (74.8 kg)     Height 04/30/18 1016 5\' 5"  (1.651 m)     Head Circumference --      Peak Flow --      Pain Score 04/30/18 1016 0     Pain Loc --      Pain Edu? --      Excl. in GC? --    No data found.  Updated Vital  Signs BP (!) 173/103 (BP Location: Left Arm)   Pulse 85   Temp 98.5 F (36.9 C) (Oral)   Resp 18   Ht 5\' 5"  (1.651 m)   Wt 74.8 kg   SpO2 99%   BMI 27.46 kg/m   Visual Acuity Right Eye Distance:   Left Eye Distance:   Bilateral Distance:    Right Eye Near:   Left Eye Near:    Bilateral Near:     Physical Exam  Constitutional: She appears well-developed.  HENT:  Head: Normocephalic and atraumatic.  Right Ear: External ear normal.  Left Ear: External ear normal.  Nose: Nose normal.  Mouth/Throat: Uvula is midline and oropharynx is clear and moist. No uvula swelling. No oropharyngeal exudate, posterior oropharyngeal edema, posterior oropharyngeal erythema or tonsillar abscesses. No tonsillar exudate.  Lower lip and left cheek area edema noted; no vesicular or other skin lesion noted   Eyes: EOM are normal.  Neck: Normal range of motion. Neck supple. No tracheal deviation present.  Cardiovascular: Normal rate, regular rhythm, normal  heart sounds and intact distal pulses.  Pulmonary/Chest: Effort normal and breath sounds normal. No stridor. No respiratory distress. She has no wheezes. She has no rales.  Nursing note and vitals reviewed.          UC Treatments / Results  Labs (all labs ordered are listed, but only abnormal results are displayed) Labs Reviewed - No data to display  EKG None  Radiology No results found.  Procedures Procedures (including critical care time)  Medications Ordered in UC Medications  methylPREDNISolone sodium succinate (SOLU-MEDROL) 125 mg/2 mL injection 125 mg (125 mg Intramuscular Given 04/30/18 1044)  famotidine (PEPCID) tablet 20 mg (20 mg Oral Given 04/30/18 1043)  diphenhydrAMINE (BENADRYL) capsule 50 mg (50 mg Oral Given 04/30/18 1042)    Initial Impression / Assessment and Plan / UC Course  I have reviewed the triage vital signs and the nursing notes.  Pertinent labs & imaging results that were available during my care of the patient were reviewed by me and considered in my medical decision making (see chart for details).      Final Clinical Impressions(s) / UC Diagnoses   Final diagnoses:  Angioedema, initial encounter     Discharge Instructions     Follow up or go to Emergency Department if symptoms worsen    ED Prescriptions    None     1. diagnosis reviewed with patient 2. Patient given 50mg  po Benadryl, 20mg  po pepcid, and 125mg  solumedrol IM x 1 with improvement of symptoms 3. Recommend supportive treatment with avoidance of trigger; continue benadryl and pepcid as needed 4. Follow-up prn or go to ED if symptoms worsen or recur  Controlled Substance Prescriptions [place] Controlled Substance Registry consulted? Not Applicable   Payton Mccallumonty, Kamiah Fite, MD 04/30/18 [phone]

## 2018-04-30 NOTE — Discharge Instructions (Signed)
Follow up or go to Emergency Department if symptoms worsen

## 2018-05-07 ENCOUNTER — Encounter: Payer: Self-pay | Admitting: Family Medicine

## 2018-05-07 ENCOUNTER — Ambulatory Visit: Payer: BC Managed Care – PPO | Admitting: Family Medicine

## 2018-05-07 VITALS — BP 138/82 | HR 80 | Ht 65.0 in | Wt 163.0 lb

## 2018-05-07 DIAGNOSIS — J0100 Acute maxillary sinusitis, unspecified: Secondary | ICD-10-CM | POA: Diagnosis not present

## 2018-05-07 MED ORDER — AZITHROMYCIN 250 MG PO TABS: ORAL_TABLET | ORAL | 0 refills | Status: DC

## 2018-05-07 MED ORDER — MONTELUKAST SODIUM 10 MG PO TABS: 10.0000 mg | ORAL_TABLET | Freq: Every day | ORAL | 3 refills | Status: DC

## 2018-05-07 NOTE — Progress Notes (Signed)
Date:  05/07/2018   Name:  Tonya Cox   DOB:  1960/11/05   MRN:  [id number]   Chief Complaint: Sinusitis (taking otc allergy med and 2 benadryl qday x 1 week- having facial pressure with production) Sinusitis  This is a new problem. The current episode started in the past 7 days. The problem is unchanged. There has been no fever. The pain is mild. Associated symptoms include congestion, coughing, ear pain, sinus pressure, sneezing and a sore throat. Pertinent negatives include no chills, diaphoresis, headaches, hoarse voice, neck pain, shortness of breath or swollen glands. Treatments tried: nonsedating antihistamine.     Review of Systems  Constitutional: Negative.  Negative for chills, diaphoresis, fatigue, fever and unexpected weight change.  HENT: Positive for congestion, ear pain, sinus pressure, sneezing and sore throat. Negative for ear discharge, hoarse voice and rhinorrhea.   Eyes: Negative for photophobia, pain, discharge, redness and itching.  Respiratory: Positive for cough. Negative for shortness of breath, wheezing and stridor.   Gastrointestinal: Negative for abdominal pain, blood in stool, constipation, diarrhea, nausea and vomiting.  Endocrine: Negative for cold intolerance, heat intolerance, polydipsia, polyphagia and polyuria.  Genitourinary: Negative for dysuria, flank pain, frequency, hematuria, menstrual problem, pelvic pain, urgency, vaginal bleeding and vaginal discharge.  Musculoskeletal: Negative for arthralgias, back pain, myalgias and neck pain.  Skin: Negative for rash.  Allergic/Immunologic: Negative for environmental allergies and food allergies.  Neurological: Negative for dizziness, weakness, light-headedness, numbness and headaches.  Hematological: Negative for adenopathy. Does not bruise/bleed easily.  Psychiatric/Behavioral: Negative for dysphoric mood. The patient is not nervous/anxious.     There are no active problems to display for this  patient.   No Known Allergies  Past Surgical History:  Procedure Laterality Date  . KNEE SURGERY Left     Social History   Tobacco Use  . Smoking status: Never Smoker  . Smokeless tobacco: Never Used  Substance Use Topics  . Alcohol use: Yes    Alcohol/week: 0.0 standard drinks  . Drug use: No     Medication list has been reviewed and updated.  Current Meds  Medication Sig  . hydrocortisone 2.5 % cream Apply topically 2 (two) times daily. Please fill    PHQ 2/9 Scores 05/07/2018 03/31/2017 09/04/2015 08/30/2015  PHQ - 2 Score 0 0 0 0  PHQ- 9 Score - 0 - -    Physical Exam  Constitutional: She is oriented to person, place, and time. She appears well-developed and well-nourished.  HENT:  Head: Normocephalic.  Right Ear: External ear normal.  Left Ear: External ear normal.  Mouth/Throat: Oropharynx is clear and moist.  Eyes: Pupils are equal, round, and reactive to light. Conjunctivae and EOM are normal. Lids are everted and swept, no foreign bodies found. Left eye exhibits no hordeolum. No foreign body present in the left eye. Right conjunctiva is not injected. Left conjunctiva is not injected. No scleral icterus.  Neck: Normal range of motion. Neck supple. No JVD present. No tracheal deviation present. No thyromegaly present.  Cardiovascular: Normal rate, regular rhythm, normal heart sounds and intact distal pulses. Exam reveals no gallop and no friction rub.  No murmur heard. Pulmonary/Chest: Effort normal and breath sounds normal. No respiratory distress. She has no wheezes. She has no rales.  Abdominal: Soft. Bowel sounds are normal. She exhibits no mass. There is no hepatosplenomegaly. There is no tenderness. There is no rebound and no guarding.  Musculoskeletal: Normal range of motion. She exhibits no edema or  tenderness.  Lymphadenopathy:    She has no cervical adenopathy.  Neurological: She is alert and oriented to person, place, and time. She has normal strength.  She displays normal reflexes. No cranial nerve deficit.  Skin: Skin is warm. No rash noted.  Psychiatric: She has a normal mood and affect. Her mood appears not anxious. She does not exhibit a depressed mood.    BP 138/82   Pulse 80   Ht 5\' 5"  (1.651 m)   Wt 163 lb (73.9 kg)   BMI 27.12 kg/m   Assessment and Plan:  1. Acute maxillary sinusitis, recurrence not specified Acute Prescribe azithromycin 250 mg as directed. - azithromycin (ZITHROMAX) 250 MG tablet; 2 today then 1 a day for 4 days  Dispense: 6 tablet; Refill: 0   Dr. [facility]  05/07/2018

## 2018-09-30 ENCOUNTER — Ambulatory Visit: Payer: BC Managed Care – PPO | Admitting: Family Medicine

## 2018-09-30 ENCOUNTER — Encounter: Payer: Self-pay | Admitting: Family Medicine

## 2018-09-30 ENCOUNTER — Other Ambulatory Visit: Payer: Self-pay

## 2018-09-30 VITALS — BP 124/82 | HR 76 | Temp 98.7°F | Resp 16 | Ht 65.0 in | Wt 164.0 lb

## 2018-09-30 DIAGNOSIS — J0100 Acute maxillary sinusitis, unspecified: Secondary | ICD-10-CM

## 2018-09-30 MED ORDER — AZITHROMYCIN 250 MG PO TABS: ORAL_TABLET | ORAL | 0 refills | Status: DC

## 2018-09-30 NOTE — Progress Notes (Signed)
Date:  09/30/2018   Name:  Tonya Cox   DOB:  1961/02/15   MRN:  [id number]   Chief Complaint: Sore Throat (2-3 days exposed to strep )  Sore Throat   This is a new problem. The current episode started yesterday. The problem has been waxing and waning. There has been no fever. Associated symptoms include congestion and a hoarse voice. Pertinent negatives include no abdominal pain, coughing, diarrhea, drooling, ear discharge, ear pain, headaches, plugged ear sensation, neck pain, shortness of breath, stridor or vomiting. She has had no exposure to strep or mono. She has tried nothing for the symptoms.  Sinusitis  This is a new problem. The current episode started in the past 7 days. The problem has been gradually worsening since onset. There has been no fever. Her pain is at a severity of 3/10. Associated symptoms include congestion, a hoarse voice, sinus pressure and a sore throat. Pertinent negatives include no chills, coughing, ear pain, headaches, neck pain, shortness of breath or sneezing. Past treatments include nothing. The treatment provided moderate relief.    Review of Systems  Constitutional: Negative.  Negative for chills, fatigue, fever and unexpected weight change.  HENT: Positive for congestion, hoarse voice, sinus pressure and sore throat. Negative for drooling, ear discharge, ear pain, rhinorrhea and sneezing.   Eyes: Negative for photophobia, pain, discharge, redness and itching.  Respiratory: Negative for cough, shortness of breath, wheezing and stridor.   Gastrointestinal: Negative for abdominal pain, blood in stool, constipation, diarrhea, nausea and vomiting.  Endocrine: Negative for cold intolerance, heat intolerance, polydipsia, polyphagia and polyuria.  Genitourinary: Negative for dysuria, flank pain, frequency, hematuria, menstrual problem, pelvic pain, urgency, vaginal bleeding and vaginal discharge.  Musculoskeletal: Negative for arthralgias, back pain,  myalgias and neck pain.  Skin: Negative for rash.  Allergic/Immunologic: Negative for environmental allergies and food allergies.  Neurological: Negative for dizziness, weakness, light-headedness, numbness and headaches.  Hematological: Negative for adenopathy. Does not bruise/bleed easily.  Psychiatric/Behavioral: Negative for dysphoric mood. The patient is not nervous/anxious.     There are no active problems to display for this patient.   No Known Allergies  Past Surgical History:  Procedure Laterality Date  . KNEE SURGERY Left     Social History   Tobacco Use  . Smoking status: Never Smoker  . Smokeless tobacco: Never Used  Substance Use Topics  . Alcohol use: Yes    Alcohol/week: 0.0 standard drinks  . Drug use: No     Medication list has been reviewed and updated.  Current Meds  Medication Sig  . meloxicam (MOBIC) 15 MG tablet Take 1 tablet (15 mg total) by mouth daily.  . [DISCONTINUED] montelukast (SINGULAIR) 10 MG tablet Take 1 tablet (10 mg total) by mouth at bedtime.    PHQ 2/9 Scores 05/07/2018 03/31/2017 09/04/2015 08/30/2015  PHQ - 2 Score 0 0 0 0  PHQ- 9 Score - 0 - -    Physical Exam Vitals signs and nursing note reviewed.  Constitutional:      General: She is not in acute distress.    Appearance: She is not diaphoretic.  HENT:     Head: Normocephalic and atraumatic.     Jaw: There is normal jaw occlusion.     Right Ear: Tympanic membrane, ear canal and external ear normal. No drainage.     Left Ear: Tympanic membrane, ear canal and external ear normal. No drainage.     Nose: Congestion present.  Right Turbinates: Swollen.     Left Turbinates: Swollen.     Right Sinus: Maxillary sinus tenderness and frontal sinus tenderness present.     Left Sinus: Maxillary sinus tenderness and frontal sinus tenderness present.  Eyes:     General:        Right eye: No discharge.        Left eye: No discharge.     Conjunctiva/sclera: Conjunctivae normal.      Pupils: Pupils are equal, round, and reactive to light.  Neck:     Musculoskeletal: Normal range of motion and neck supple.     Thyroid: No thyromegaly.     Vascular: No JVD.  Cardiovascular:     Rate and Rhythm: Normal rate and regular rhythm.     Pulses: Normal pulses. No decreased pulses.     Heart sounds: Normal heart sounds, S1 normal and S2 normal. No murmur. No systolic murmur. No diastolic murmur. No friction rub. No gallop. No S3 or S4 sounds.   Pulmonary:     Effort: Pulmonary effort is normal.     Breath sounds: Normal breath sounds.  Abdominal:     General: Bowel sounds are normal.     Palpations: Abdomen is soft. There is no mass.     Tenderness: There is no abdominal tenderness. There is no guarding.  Musculoskeletal: Normal range of motion.     Right lower leg: No edema.     Left lower leg: No edema.  Lymphadenopathy:     Cervical: No cervical adenopathy.  Skin:    General: Skin is warm and dry.  Neurological:     Mental Status: She is alert.     Deep Tendon Reflexes: Reflexes are normal and symmetric.     BP 124/82   Pulse 76   Temp 98.7 F (37.1 C) (Oral)   Resp 16   Ht 5\' 5"  (1.651 m)   Wt 164 lb (74.4 kg)   SpO2 98%   BMI 27.29 kg/m   Assessment and Plan: 1. Acute maxillary sinusitis, recurrence not specified Acute.  Persistent.  Exam notes tenderness over maxillary sinuses.  Will initiate a azithromycin 250 mg 2 today then 1 a day for 4 days. - azithromycin (ZITHROMAX) 250 MG tablet; 2 today then 1 a day for 4 days  Dispense: 6 tablet; Refill: 0

## 2018-11-19 ENCOUNTER — Ambulatory Visit: Payer: BC Managed Care – PPO | Admitting: Family Medicine

## 2018-11-19 ENCOUNTER — Other Ambulatory Visit: Payer: Self-pay

## 2018-11-19 ENCOUNTER — Encounter: Payer: Self-pay | Admitting: Family Medicine

## 2018-11-19 VITALS — BP 120/70 | HR 76 | Ht 65.0 in | Wt 157.0 lb

## 2018-11-19 DIAGNOSIS — L255 Unspecified contact dermatitis due to plants, except food: Secondary | ICD-10-CM | POA: Diagnosis not present

## 2018-11-19 MED ORDER — PREDNISONE 10 MG PO TABS: ORAL_TABLET | ORAL | 1 refills | Status: DC

## 2018-11-19 NOTE — Patient Instructions (Signed)
This information is directly available on the CDC website: https://www.cdc.gov/coronavirus/2019-ncov/if-you-are-sick/steps-when-sick.html    Source:CDC Reference to specific commercial products, manufacturers, companies, or trademarks does not constitute its endorsement or recommendation by the U.S. Government, Department of Health and Human Services, or Centers for Disease Control and Prevention.  

## 2018-11-19 NOTE — Progress Notes (Signed)
Date:  11/19/2018   Name:  Tonya Cox   DOB:  18-Apr-1961   MRN:  [id number]   Chief Complaint: Rash (rash on wrist- working out in the yard, still got a little bit of poison ivey- using hydrocortisone 1.5% )  Rash  This is a new problem. The current episode started yesterday. The affected locations include the right wrist. The rash is characterized by redness, itchiness and blistering. She was exposed to plant contact. Pertinent negatives include no anorexia, congestion, cough, diarrhea, eye pain, facial edema, fatigue, fever, joint pain, nail changes, rhinorrhea, shortness of breath, sore throat or vomiting. Past treatments include topical steroids. The treatment provided no relief.    Review of Systems  Constitutional: Negative.  Negative for chills, fatigue, fever and unexpected weight change.  HENT: Negative for congestion, ear discharge, ear pain, rhinorrhea, sinus pressure, sneezing and sore throat.   Eyes: Negative for photophobia, pain, discharge, redness and itching.  Respiratory: Negative for cough, shortness of breath, wheezing and stridor.   Cardiovascular: Negative for chest pain, palpitations and leg swelling.  Gastrointestinal: Negative for abdominal pain, anorexia, blood in stool, constipation, diarrhea, nausea and vomiting.  Endocrine: Negative for cold intolerance, heat intolerance, polydipsia, polyphagia and polyuria.  Genitourinary: Negative for dysuria, flank pain, frequency, hematuria, menstrual problem, pelvic pain, urgency, vaginal bleeding and vaginal discharge.  Musculoskeletal: Negative for arthralgias, back pain, joint pain and myalgias.  Skin: Positive for rash. Negative for nail changes.  Allergic/Immunologic: Negative for environmental allergies and food allergies.  Neurological: Negative for dizziness, weakness, light-headedness, numbness and headaches.  Hematological: Negative for adenopathy. Does not bruise/bleed easily.  Psychiatric/Behavioral:  Negative for dysphoric mood. The patient is not nervous/anxious.     There are no active problems to display for this patient.   No Known Allergies  Past Surgical History:  Procedure Laterality Date  . KNEE SURGERY Left     Social History   Tobacco Use  . Smoking status: Never Smoker  . Smokeless tobacco: Never Used  Substance Use Topics  . Alcohol use: Yes    Alcohol/week: 0.0 standard drinks  . Drug use: No     Medication list has been reviewed and updated.  Current Meds  Medication Sig  . meloxicam (MOBIC) 15 MG tablet Take 1 tablet (15 mg total) by mouth daily.    PHQ 2/9 Scores 05/07/2018 03/31/2017 09/04/2015 08/30/2015  PHQ - 2 Score 0 0 0 0  PHQ- 9 Score - 0 - -    BP Readings from Last 3 Encounters:  11/19/18 120/70  09/30/18 124/82  05/07/18 138/82    Physical Exam Vitals signs and nursing note reviewed.  Constitutional:      General: She is not in acute distress.    Appearance: She is normal weight. She is not diaphoretic.  HENT:     Head: Normocephalic and atraumatic.     Right Ear: External ear normal.     Left Ear: External ear normal.     Nose: Nose normal. No congestion or rhinorrhea.  Eyes:     General:        Right eye: No discharge.        Left eye: No discharge.     Conjunctiva/sclera: Conjunctivae normal.     Pupils: Pupils are equal, round, and reactive to light.  Neck:     Musculoskeletal: Normal range of motion and neck supple.     Thyroid: No thyromegaly.     Vascular: No JVD.  Cardiovascular:  Rate and Rhythm: Normal rate and regular rhythm.     Heart sounds: Normal heart sounds. No murmur. No friction rub. No gallop.   Pulmonary:     Effort: Pulmonary effort is normal.     Breath sounds: Normal breath sounds. No wheezing, rhonchi or rales.  Chest:     Chest wall: No tenderness.  Abdominal:     General: Bowel sounds are normal.     Palpations: Abdomen is soft. There is no mass.     Tenderness: There is no abdominal  tenderness. There is no guarding.  Musculoskeletal: Normal range of motion.  Lymphadenopathy:     Cervical: No cervical adenopathy.  Skin:    General: Skin is warm and dry.  Neurological:     Mental Status: She is alert.     Deep Tendon Reflexes: Reflexes are normal and symmetric.     Wt Readings from Last 3 Encounters:  11/19/18 157 lb (71.2 kg)  09/30/18 164 lb (74.4 kg)  05/07/18 163 lb (73.9 kg)    BP 120/70   Pulse 76   Ht 5\' 5"  (1.651 m)   Wt 157 lb (71.2 kg)   BMI 26.13 kg/m   Assessment and Plan: 1. Contact dermatitis due to plant New onset limited to 1 arm.  Will place on prednisone taper beginning at 60 mg and pay for over a 2-week.  Patient was instructed not to stop it early or she may have rebound.  Refill was provided if she should come in contact again. - predniSONE (DELTASONE) 10 MG tablet; Taper 6,6,6,5,5,5,4,4,3,3,2,2,1,1  Dispense: 53 tablet; Refill: 1

## 2018-11-26 ENCOUNTER — Other Ambulatory Visit: Payer: Self-pay

## 2018-11-26 DIAGNOSIS — L255 Unspecified contact dermatitis due to plants, except food: Secondary | ICD-10-CM

## 2018-11-26 MED ORDER — HYDROCORTISONE 2.5 % EX CREA: TOPICAL_CREAM | Freq: Two times a day (BID) | CUTANEOUS | 0 refills | Status: AC

## 2018-12-24 ENCOUNTER — Other Ambulatory Visit: Payer: Self-pay | Admitting: Family Medicine

## 2018-12-24 DIAGNOSIS — M1710 Unilateral primary osteoarthritis, unspecified knee: Secondary | ICD-10-CM

## 2018-12-31 ENCOUNTER — Ambulatory Visit: Payer: BC Managed Care – PPO | Admitting: Family Medicine

## 2018-12-31 ENCOUNTER — Other Ambulatory Visit: Payer: Self-pay

## 2018-12-31 ENCOUNTER — Encounter: Payer: Self-pay | Admitting: Family Medicine

## 2018-12-31 VITALS — BP 130/80 | HR 80 | Ht 65.0 in | Wt 165.0 lb

## 2018-12-31 DIAGNOSIS — L309 Dermatitis, unspecified: Secondary | ICD-10-CM | POA: Diagnosis not present

## 2018-12-31 MED ORDER — PREDNISONE 10 MG PO TABS: 10.0000 mg | ORAL_TABLET | Freq: Every day | ORAL | 0 refills | Status: AC

## 2018-12-31 MED ORDER — TRIAMCINOLONE ACETONIDE 0.1 % EX CREA: 1.0000 "application " | TOPICAL_CREAM | Freq: Two times a day (BID) | CUTANEOUS | 1 refills | Status: AC

## 2018-12-31 NOTE — Progress Notes (Addendum)
Date:  12/31/2018   Name:  Tonya Cox   DOB:  1960-11-20   MRN:  [id number]   Chief Complaint: Rash (red spots on abdomen and arms- itches)  Rash  This is a new problem. The current episode started in the past 7 days (monday). The problem is unchanged. The affected locations include the abdomen, right arm and left arm. The rash is characterized by redness and itchiness. Associated with: unknown. Pertinent negatives include no anorexia, congestion, cough, diarrhea, eye pain, facial edema, fatigue, fever, joint pain, nail changes, rhinorrhea, shortness of breath, sore throat or vomiting. Past treatments include anti-itch cream and topical steroids. The treatment provided moderate relief.    Review of Systems  Constitutional: Negative.  Negative for chills, fatigue, fever and unexpected weight change.  HENT: Negative for congestion, ear discharge, ear pain, rhinorrhea, sinus pressure, sneezing and sore throat.   Eyes: Negative for photophobia, pain, discharge, redness and itching.  Respiratory: Negative for cough, shortness of breath, wheezing and stridor.   Gastrointestinal: Negative for abdominal pain, anorexia, blood in stool, constipation, diarrhea, nausea and vomiting.  Endocrine: Negative for cold intolerance, heat intolerance, polydipsia, polyphagia and polyuria.  Genitourinary: Negative for dysuria, flank pain, frequency, hematuria, menstrual problem, pelvic pain, urgency, vaginal bleeding and vaginal discharge.  Musculoskeletal: Negative for arthralgias, back pain, joint pain and myalgias.  Skin: Positive for rash. Negative for nail changes.  Allergic/Immunologic: Negative for environmental allergies and food allergies.  Neurological: Negative for dizziness, weakness, light-headedness, numbness and headaches.  Hematological: Negative for adenopathy. Does not bruise/bleed easily.  Psychiatric/Behavioral: Negative for dysphoric mood. The patient is not nervous/anxious.      There are no active problems to display for this patient.   No Known Allergies  Past Surgical History:  Procedure Laterality Date  . KNEE SURGERY Left     Social History   Tobacco Use  . Smoking status: Never Smoker  . Smokeless tobacco: Never Used  Substance Use Topics  . Alcohol use: Yes    Alcohol/week: 0.0 standard drinks  . Drug use: No     Medication list has been reviewed and updated.  Current Meds  Medication Sig  . hydrocortisone 2.5 % cream Apply topically 2 (two) times daily.  . meloxicam (MOBIC) 15 MG tablet TAKE 1 TABLET BY MOUTH EVERY DAY    PHQ 2/9 Scores 05/07/2018 03/31/2017 09/04/2015 08/30/2015  PHQ - 2 Score 0 0 0 0  PHQ- 9 Score - 0 - -    BP Readings from Last 3 Encounters:  12/31/18 130/80  11/19/18 120/70  09/30/18 124/82    Physical Exam Vitals signs and nursing note reviewed.  Constitutional:      General: She is not in acute distress.    Appearance: She is not diaphoretic.  HENT:     Head: Normocephalic and atraumatic.     Right Ear: External ear normal.     Left Ear: External ear normal.     Nose: Nose normal.  Eyes:     General:        Right eye: No discharge.        Left eye: No discharge.     Conjunctiva/sclera: Conjunctivae normal.     Pupils: Pupils are equal, round, and reactive to light.  Neck:     Musculoskeletal: Normal range of motion and neck supple.     Thyroid: No thyromegaly.     Vascular: No JVD.  Cardiovascular:     Rate and Rhythm: Normal rate  and regular rhythm.     Heart sounds: Normal heart sounds. No murmur. No friction rub. No gallop.   Pulmonary:     Effort: Pulmonary effort is normal.     Breath sounds: Normal breath sounds.  Abdominal:     General: Bowel sounds are normal.     Palpations: Abdomen is soft. There is no mass.     Tenderness: There is no abdominal tenderness. There is no guarding.  Musculoskeletal: Normal range of motion.  Lymphadenopathy:     Cervical: No cervical adenopathy.   Skin:    General: Skin is warm and dry.     Findings: Erythema present.  Neurological:     Mental Status: She is alert.     Deep Tendon Reflexes: Reflexes are normal and symmetric.     Wt Readings from Last 3 Encounters:  12/31/18 165 lb (74.8 kg)  11/19/18 157 lb (71.2 kg)  09/30/18 164 lb (74.4 kg)    BP 130/80   Pulse 80   Ht 5\' 5"  (1.651 m)   Wt 165 lb (74.8 kg)   BMI 27.46 kg/m   Assessment and Plan: 1. Dermatitis New onset punctate areas on the abdomen and the flexor surface of the arms consistent more with an insect bite by patient recently has gone to the beach and state that a peak facility as well as frequented the beach area this appears to be most likely of an insect nature.  Will use triamcinolone on the individual areas and suggested using prednisone once a day as needed to decrease inflammation also suggested using antihistamines. - triamcinolone cream (KENALOG) 0.1 %; Apply 1 application topically 2 (two) times daily.  Dispense: 453.6 g; Refill: 1 - predniSONE (DELTASONE) 10 MG tablet; Take 1 tablet (10 mg total) by mouth daily with breakfast.  Dispense: 14 tablet; Refill: 0

## 2019-03-21 ENCOUNTER — Telehealth: Payer: Self-pay

## 2019-03-21 NOTE — Telephone Encounter (Signed)
WOuld be best to go to the urgent care. Explain to her [facility] does not prescribe pain meds and if the ones her sister gave her didn't work, she will probably need xray and something stronger. In which case, [facility] definitely can't help with. I was thinking she has seen ortho before for her back. You can ask her if that's the case/ if so, she can check in with them Thank you

## 2019-03-21 NOTE — Telephone Encounter (Signed)
Advised Ortho or [facility].Patient very upset and I explained to her that we do not have a problem seeing her but it would be faster and easier for meds if she went to [facility]. She asked why we even see her if she can not be seen for Cortisone Inj and can not be seen for narcotics. Explained that is [employer] and [employer] issues that we can not get around and also explained well visits and screenings are needed yearly.

## 2019-03-21 NOTE — Telephone Encounter (Signed)
Left message that she has injured back over weekend and sister gave her pain meds. Would like to have appt tomorrow to be seen since meds did not help. I can schedule if you need me to.

## 2019-06-09 IMAGING — CT CT RENAL STONE PROTOCOL
2 of 4 series · 15 of 46 positions shown, 17 images · non-contrast
Comparison: Abdominal ultrasound performed 03/04/2012

CLINICAL DATA: Acute onset of left flank pain and difficulty
voiding. Initial encounter.

EXAM:
CT ABDOMEN AND PELVIS WITHOUT CONTRAST
TECHNIQUE: Multidetector CT imaging of the abdomen and pelvis was performed
following the standard protocol without IV contrast.

[Series 2: stone full standard · axial · 0.70mm/px · z∈[-1023,-623]mm · 12 of 90 slices shown, 14 images]
[im 5/90  soft-tissue]
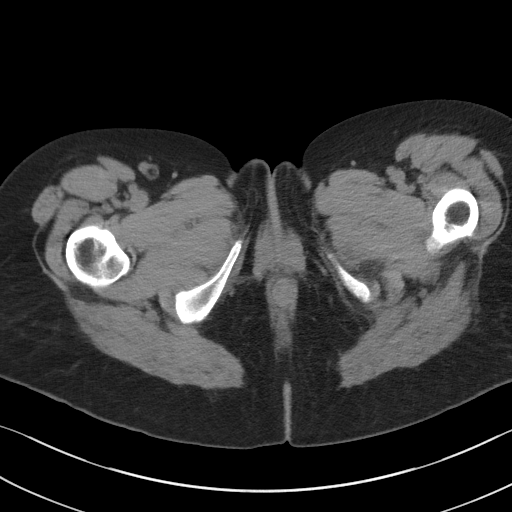
[im 5/90  bone]
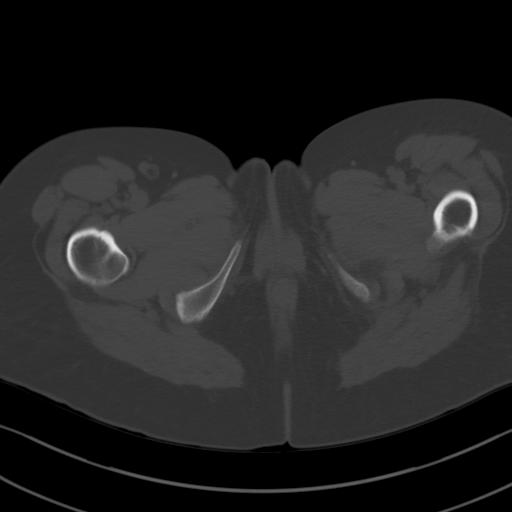
[im 13/90  soft-tissue]
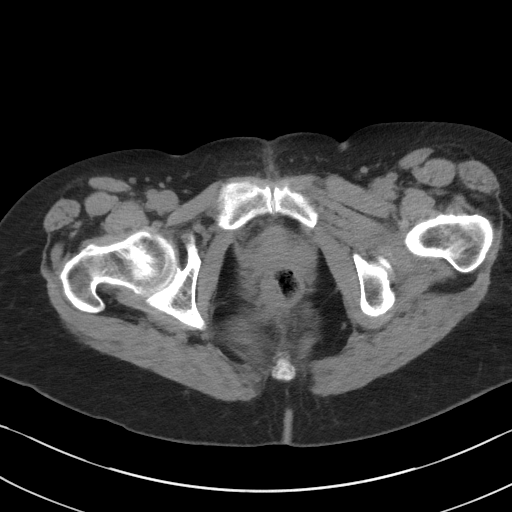
[im 22/90  soft-tissue]
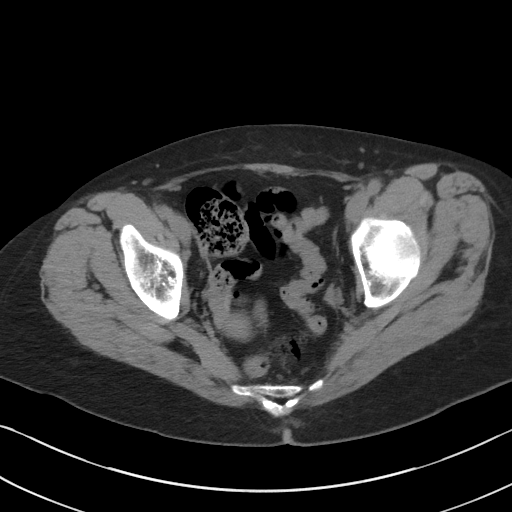
[im 26/90  soft-tissue]
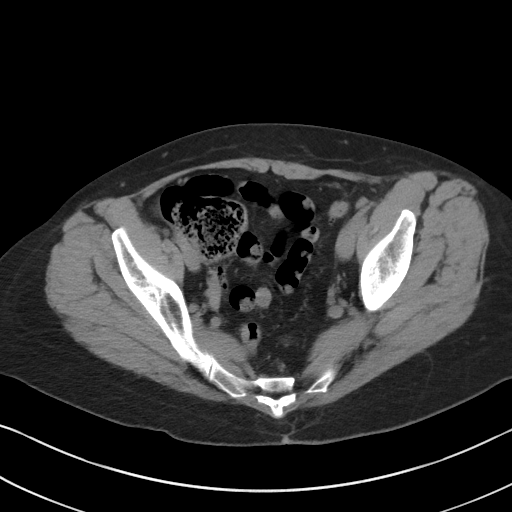
[im 34/90  soft-tissue]
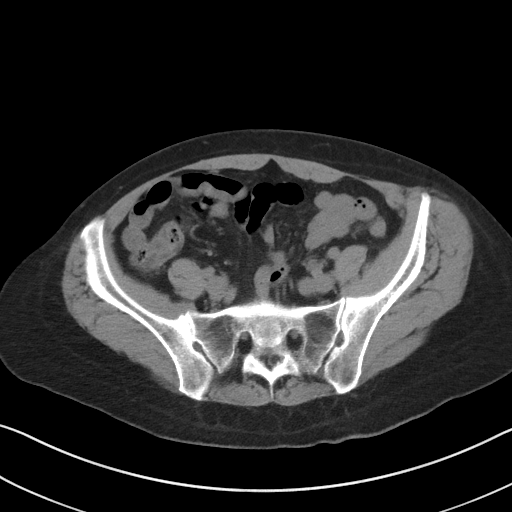
[im 43/90  soft-tissue]
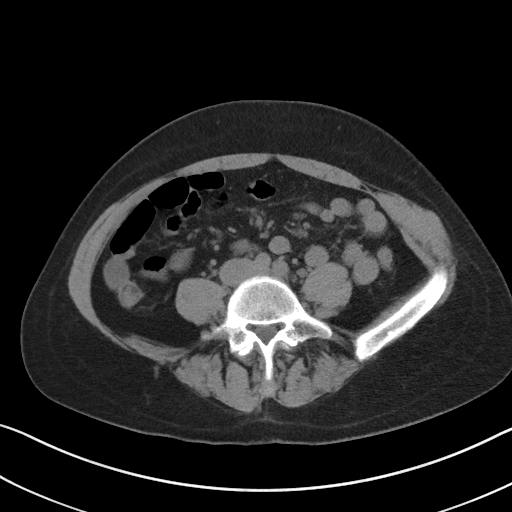
[im 47/90  soft-tissue]
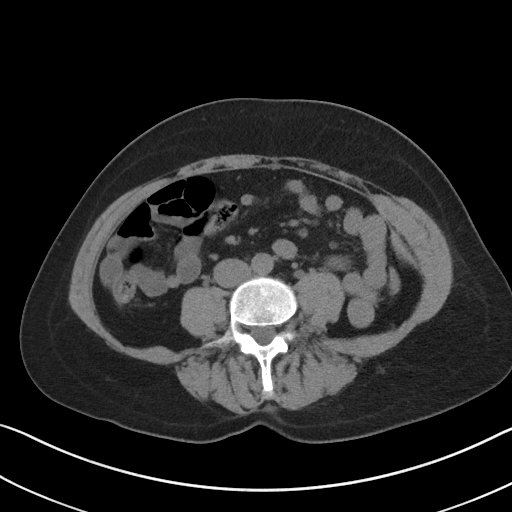
[im 56/90  soft-tissue]
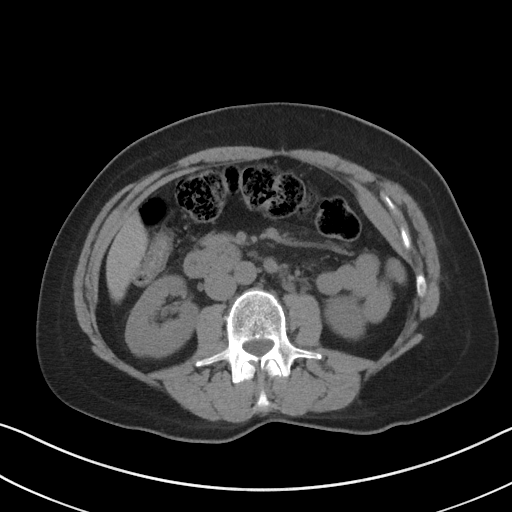
[im 64/90  soft-tissue]
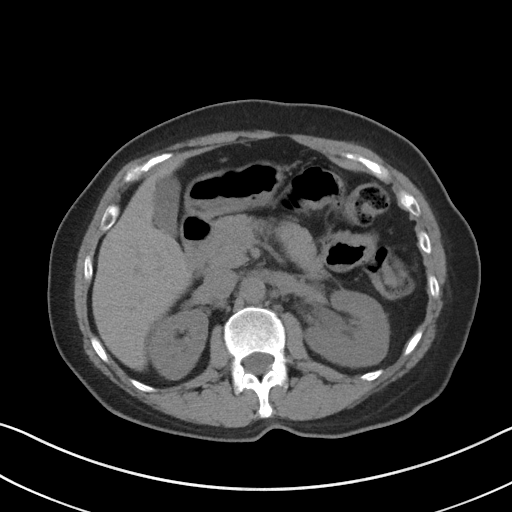
[im 64/90  bone]
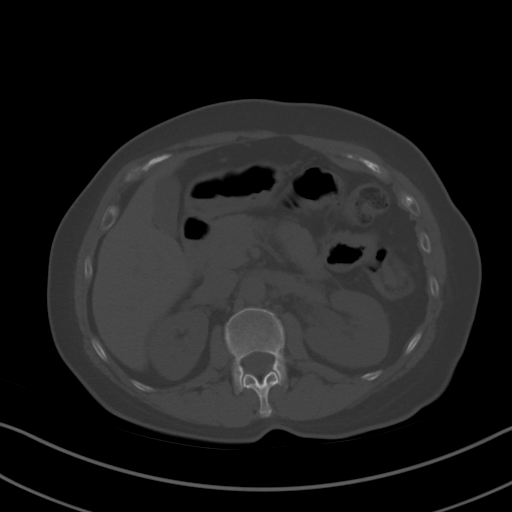
[im 68/90  soft-tissue]
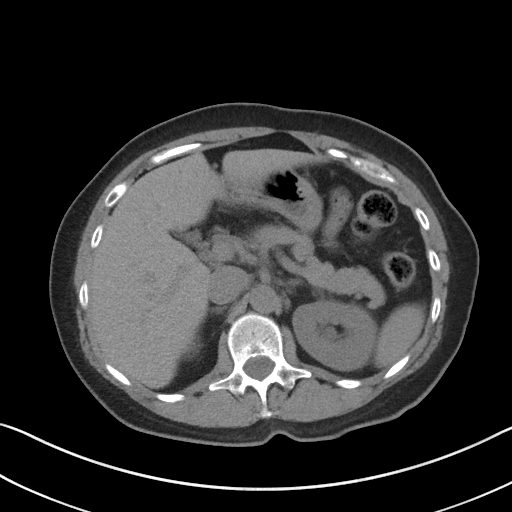
[im 77/90  soft-tissue]
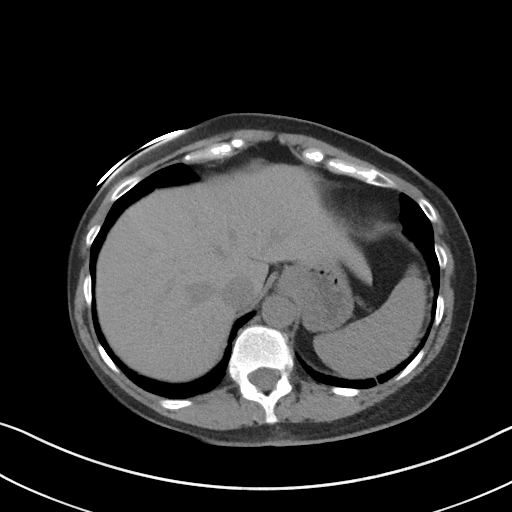
[im 85/90  soft-tissue]
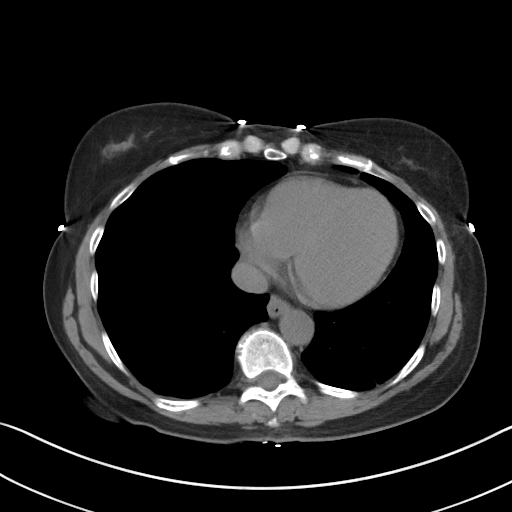

[Series 5: coronal · coronal · 0.65mm/px · 3 of 116 slices shown]
[im 39/116  soft-tissue]
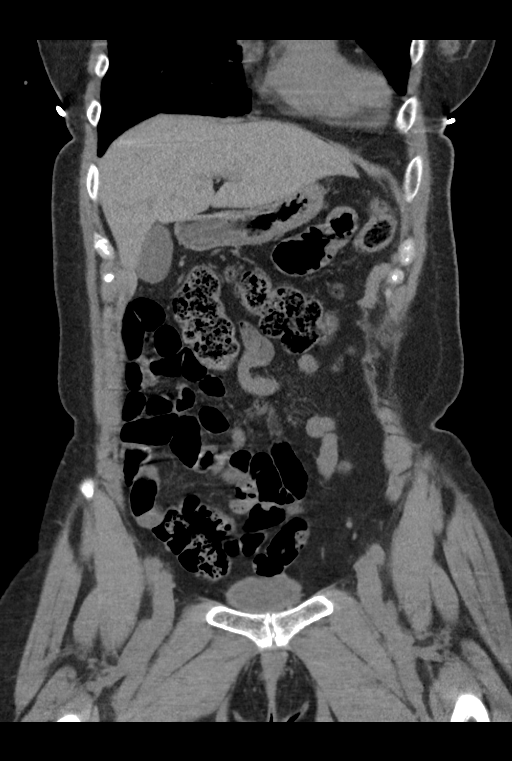
[im 52/116  soft-tissue]
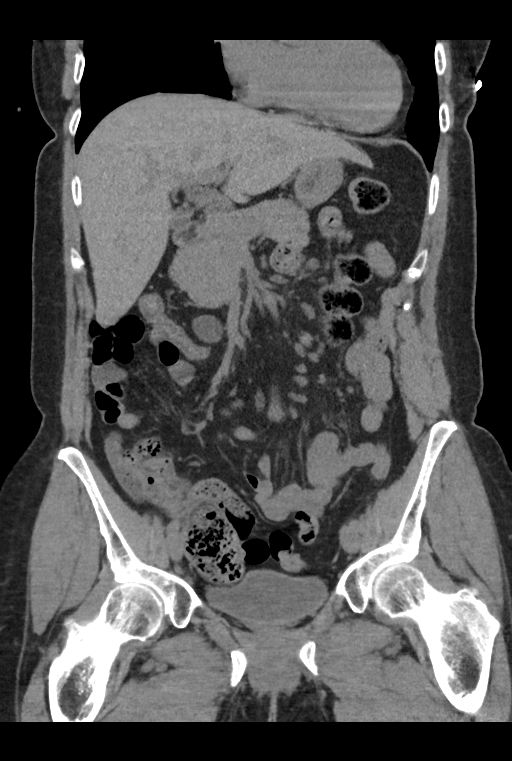
[im 64/116  soft-tissue]
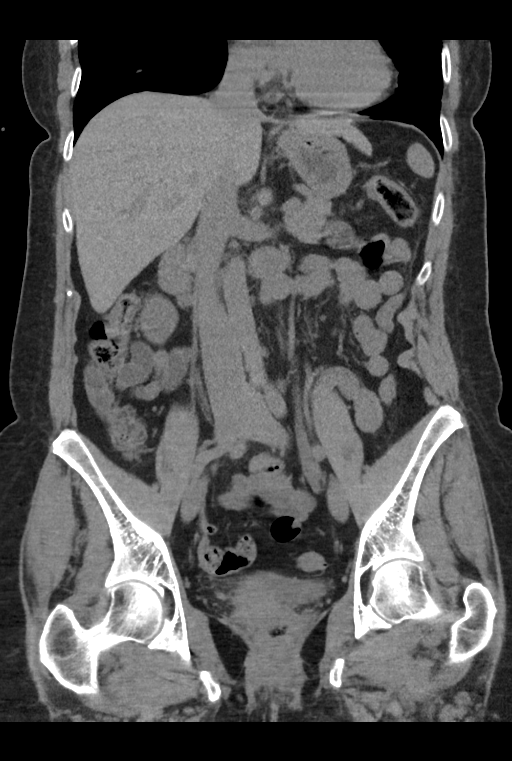

[15 of 46 positions shown; findings below may reference images not displayed]

FINDINGS: Lower chest: The visualized lung bases are grossly clear. The
visualized portions of the mediastinum are unremarkable.

Hepatobiliary: A small hepatic cyst is noted. The liver is otherwise
unremarkable. The gallbladder is within normal limits. The common
bile duct is normal in caliber.

Pancreas: The pancreas is within normal limits.

Spleen: The spleen is unremarkable in appearance.

Adrenals/Urinary Tract: The adrenal glands are unremarkable in
appearance.

Mild left-sided hydronephrosis is noted, with distention of the left
ureter along its entire course. An obstructing 4 x 3 mm stone is
noted at the left side of the base of the bladder, along the left
vesicoureteral junction.

Stomach/Bowel: The stomach is unremarkable in appearance. The small
bowel is within normal limits. The appendix is normal in caliber,
without evidence of appendicitis. The colon is unremarkable in
appearance.

Vascular/Lymphatic: The abdominal aorta is unremarkable in
appearance. The inferior vena cava is grossly unremarkable. No
retroperitoneal lymphadenopathy is seen. No pelvic sidewall
lymphadenopathy is identified.

Reproductive: The bladder is mildly distended and otherwise
unremarkable. The uterus is unremarkable in appearance. The ovaries
are relatively symmetric. No suspicious adnexal masses are seen.

Other: No additional soft tissue abnormalities are seen.

Musculoskeletal: No acute osseous abnormalities are identified. The
visualized musculature is unremarkable in appearance.
IMPRESSION: 1. Mild left-sided hydronephrosis, with obstructing 4 x 3 mm stone
at the left side of the base of the bladder, along the left
vesicoureteral junction.
2. Small hepatic cyst noted.

## 2020-01-20 ENCOUNTER — Ambulatory Visit: Payer: BC Managed Care – PPO | Attending: Internal Medicine

## 2020-01-20 DIAGNOSIS — Z23 Encounter for immunization: Secondary | ICD-10-CM

## 2020-01-20 NOTE — Progress Notes (Signed)
   Covid-19 Vaccination Clinic  Name:  Tonya Cox    MRN: [id number] DOB: Apr 06, 1961  01/20/2020  Ms. [name] was observed post Covid-19 immunization for 30 minutes based on pre-vaccination screening without incident. She was provided with Vaccine Information Sheet and instruction to access the V-Safe system.   Ms. [name] was instructed to call 911 with any severe reactions post vaccine: Marland Kitchen Difficulty breathing  . Swelling of face and throat  . A fast heartbeat  . A bad rash all over body  . Dizziness and weakness   Immunizations Administered    Name Date Dose VIS Date Route   Pfizer COVID-19 Vaccine 01/20/2020 11:14 AM 0.3 mL 10/05/2018 Intramuscular   Manufacturer: [employer], [employer]   Lot: [id number]   NDC: [address]-5

## 2020-02-14 ENCOUNTER — Ambulatory Visit: Payer: BC Managed Care – PPO | Attending: Internal Medicine

## 2020-02-14 DIAGNOSIS — Z23 Encounter for immunization: Secondary | ICD-10-CM

## 2020-02-14 NOTE — Progress Notes (Signed)
   Covid-19 Vaccination Clinic  Name:  Tonya Cox    MRN: [id number] DOB: 21-Jul-1961  02/14/2020  [name] was observed post Covid-19 immunization for 15 minutes without incident. She was provided with Vaccine Information Sheet and instruction to access the V-Safe system.   [name] was instructed to call 911 with any severe reactions post vaccine: Marland Kitchen Difficulty breathing  . Swelling of face and throat  . A fast heartbeat  . A bad rash all over body  . Dizziness and weakness   Immunizations Administered    Name Date Dose VIS Date Route   Pfizer COVID-19 Vaccine 02/14/2020 10:51 AM 0.3 mL 10/05/2018 Intramuscular   Manufacturer: [employer], [employer]   Lot: [id number]   NDC: [address]-8

## 2024-01-08 ENCOUNTER — Emergency Department
Admission: EM | Admit: 2024-01-08 | Discharge: 2024-01-08 | Disposition: A | Discharge status: Home / Self Care | Attending: Emergency Medicine | Admitting: Emergency Medicine

## 2024-01-08 ENCOUNTER — Emergency Department

## 2024-01-08 DIAGNOSIS — S0990XA Unspecified injury of head, initial encounter: Secondary | ICD-10-CM | POA: Diagnosis present

## 2024-01-08 DIAGNOSIS — W208XXA Other cause of strike by thrown, projected or falling object, initial encounter: Secondary | ICD-10-CM | POA: Insufficient documentation

## 2024-01-08 DIAGNOSIS — Y92096 Garden or yard of other non-institutional residence as the place of occurrence of the external cause: Secondary | ICD-10-CM | POA: Insufficient documentation

## 2024-01-08 DIAGNOSIS — S0101XA Laceration without foreign body of scalp, initial encounter: Secondary | ICD-10-CM | POA: Diagnosis not present

## 2024-01-08 MED ORDER — TRAMADOL HCL 50 MG PO TABS
100.0000 mg | ORAL_TABLET | Freq: Once | ORAL | Status: AC
  Administered 2024-01-08: 100 mg via ORAL
  Filled 2024-01-08: qty 2

## 2024-01-08 MED ORDER — LIDOCAINE-EPINEPHRINE-TETRACAINE (LET) TOPICAL GEL
3.0000 mL | Freq: Once | TOPICAL | Status: AC
  Administered 2024-01-08: 3 mL via TOPICAL
  Filled 2024-01-08: qty 3

## 2024-01-08 NOTE — ED Provider Notes (Addendum)
 [facility] Provider Note    Event Date/Time   First MD Initiated Contact with Patient 01/08/24 1513     (approximate)   History   Head Injury    HPI  Tonya Cox is a 63 y.o. female    with a past medical history of lumbar radiculopathy, fracture of ankle, contact dermatitis,who presents to the ED complaining of head injury  . According to the patient, she was doing yard work and big branch feel and hit her head. She states she can remember everything  but it was dark for a few seconds. She was bleeding and was able to got up and walk to the door. Patient denies emesis, headache, blurry vision.  Patient is not taking blood thinners.  Per independent chart review patient had Tdap on 07/2021.       Physical Exam   Triage Vital Signs: ED Triage Vitals  Encounter Vitals Group     BP 01/08/24 1438 (!) 144/109     Systolic BP Percentile --      Diastolic BP Percentile --      Pulse Rate 01/08/24 1438 89     Resp 01/08/24 1438 20     Temp 01/08/24 1438 97.9 F (36.6 C)     Temp Source 01/08/24 1438 Oral     SpO2 --      Weight --      Height --      Head Circumference --      Peak Flow --      Pain Score 01/08/24 1439 5     Pain Loc --      Pain Education --      Exclude from Growth Chart --     Most recent vital signs: Vitals:   01/08/24 1438  BP: (!) 144/109  Pulse: 89  Resp: 20  Temp: 97.9 F (36.6 C)     Constitutional: Alert, NAD. Able to speak in complete sentences without cough or dyspnea  Eyes: Conjunctivae are normal.  PERRLA Head: Laceration #1 :left coronal  area, proximal 2 cm long, 3 mm deep.  No active bleeding at the moment Laceration#2:  laceration 21 cm long 2 mm deepth. No active bleeding.  Nose: No congestion/rhinnorhea. Mouth/Throat: Mucous membranes are moist.   Neck: Painless ROM. Supple. No JVD, nodes, thyromegaly not tender to palpation in the spinal process or paraspinal muscles Cardiovascular:   Good  peripheral circulation.RRR no murmurs, gallops, rubs  Respiratory: Normal respiratory effort.  No retractions. Clear to auscultation bilaterally without wheezing or crackles  Gastrointestinal: Soft and nontender.  Musculoskeletal:  no deformity Neurologic:  MAE spontaneously. No gross focal neurologic deficits are appreciated.  Skin:  Skin is warm, dry and intact. No rash noted. Psychiatric: Mood and affect are normal. Speech and behavior are normal.    ED Results / Procedures / Treatments   Labs (all labs ordered are listed, but only abnormal results are displayed) Labs Reviewed - No data to display   EKG     RADIOLOGY I independently reviewed and interpreted imaging and agree with radiologists findings.      PROCEDURES:  Critical Care performed:   .Laceration Repair  Date/Time: 01/08/2024 4:40 PM  Performed by: Awilda Lennox, PA-C Authorized by: Awilda Lennox, PA-C   Consent:    Consent obtained:  Verbal   Consent given by:  Patient   Risks discussed:  Infection and pain Universal protocol:    Procedure explained and questions answered to patient  or proxy's satisfaction: yes     Patient identity confirmed:  Verbally with patient Anesthesia:    Anesthesia method:  Topical application Laceration details:    Location:  Scalp   Scalp location:  Crown   Length (cm):  2   Depth (mm):  2 Exploration:    Hemostasis achieved with:  LET Treatment:    Area cleansed with:  Saline   Amount of cleaning:  Standard   Irrigation solution:  Sterile saline   Visualized foreign bodies/material removed: no     Debridement:  None Skin repair:    Repair method:  Staples   Number of staples:  3 Approximation:    Approximation:  Close Repair type:    Repair type:  Simple Post-procedure details:    Procedure completion:  Tolerated well, no immediate complications .Laceration Repair  Date/Time: 01/08/2024 4:41 PM  Performed by: Awilda Lennox, PA-C Authorized by:  Awilda Lennox, PA-C   Consent:    Consent obtained:  Verbal   Consent given by:  Patient   Risks, benefits, and alternatives were discussed: yes     Risks discussed:  Infection and pain Universal protocol:    Procedure explained and questions answered to patient or proxy's satisfaction: yes     Patient identity confirmed:  Verbally with patient Anesthesia:    Anesthesia method:  Topical application Laceration details:    Location:  Scalp   Scalp location:  Crown   Length (cm):  1   Depth (mm):  2 Exploration:    Limited defect created (wound extended): no     Hemostasis achieved with:  LET Treatment:    Area cleansed with:  Povidone-iodine   Amount of cleaning:  Standard   Irrigation solution:  Sterile saline   Visualized foreign bodies/material removed: no     Debridement:  None Skin repair:    Repair method:  Staples   Number of staples:  1 Approximation:    Approximation:  Close Repair type:    Repair type:  Simple Post-procedure details:    Procedure completion:  Tolerated well, no immediate complications    MEDICATIONS ORDERED IN ED: Medications  lidocaine -EPINEPHrine -tetracaine  (LET) topical gel (3 mLs Topical Given 01/08/24 1603)  traMADol  (ULTRAM ) tablet 100 mg (100 mg Oral Given 01/08/24 1700)   Clinical Course as of 01/08/24 1729  Fri Jan 08, 2024  1713 Reassessed the patient. Patient states having a pain after staples. Gave her tramadol .  [AE]  1729 CT Head Wo Contrast  No acute intracranial pathology. 2. Soft tissue contusion and laceration of the left scalp vertex. 3. No fracture or static subluxation of the cervical spine. 4. Focally mild disc space height loss and osteophytosis of the lower cervical levels from C5-C7.   [AE]    Clinical Course User Index [AE] Awilda Lennox, PA-C    IMPRESSION / MDM / ASSESSMENT AND PLAN / ED COURSE  I reviewed the triage vital signs and the nursing notes.  Differential diagnosis includes, but is not  limited to, fracture, laceration, intracranial hemorrhage,  Patient's presentation is most consistent with acute complicated illness / injury requiring diagnostic workup.  Patient's diagnosis is consistent with lacerations on crown . I independently reviewed and interpreted imaging and agree with radiologists findings ruling out intracranial pathology, fracture. I did review the patient's allergies and medications.The patient is in stable and satisfactory condition for discharge home  Patient will be discharged home without  prescriptions. Patient states she has tramadol  at home ans will take it as  needed for pain. Per patient ibuprofen doesn't work. Patient states she doesn't have a PCP, I referred patient to [facility] clinic to establish care and for staples removal.  Patient is to follow up with PCP as needed or otherwise directed. Patient is given ED precautions to return to the ED for any worsening or new symptoms. Discussed plan of care with patient, answered all of patient's questions, Patient agreeable to plan of care. Advised patient to take medications according to the instructions on the label. Discussed possible side effects of new medications. Patient verbalized understanding.    FINAL CLINICAL IMPRESSION(S) / ED DIAGNOSES   Final diagnoses:  Injury of head, initial encounter  Laceration of scalp, initial encounter     Rx / DC Orders   ED Discharge Orders          Ordered    traMADol  (ULTRAM ) 50 MG tablet  Every 6 hours PRN        Pending             Note:  This document was prepared using Dragon voice recognition software and may include unintentional dictation errors.   Awilda Lennox, PA-C 01/08/24 1720    Awilda Lennox, PA-C 01/08/24 1731    Awilda Lennox, PA-C 01/08/24 1735    Bradler, Evan K, MD 01/08/24 [id number]

## 2024-01-08 NOTE — ED Triage Notes (Signed)
 Pt to ED via POV from home. Pt was out in the yard doing work under a old tree and a large branch fell strike posterior head. Pt reports possible LOC for a few seconds. No thinners. No emesis. Pt has large hematoma to posterior head with some active bleeding still noted. Pt A&OX4.

## 2024-01-08 NOTE — Discharge Instructions (Addendum)
 You have been diagnosed with head lacerations (2), head injury. Please take tramadol  for pain as needed. Please make an appointment at [facility] clinic to establish care and for staples removal. If  you can not get an appointment in 7 days you can go to urgent care for staples removal.Please come back to ED if you have new symptoms or symptoms worsen.
